# Patient Record
Sex: Male | Born: 1965 | Race: Black or African American | Hispanic: No | Marital: Single | Smoking: Former smoker
Health system: Southern US, Community
[De-identification: ages and names within clinical notes are randomized; demographics above are authoritative.]

## PROBLEM LIST (undated history)

## (undated) DIAGNOSIS — E876 Hypokalemia: Secondary | ICD-10-CM

## (undated) DIAGNOSIS — I10 Essential (primary) hypertension: Secondary | ICD-10-CM

## (undated) DIAGNOSIS — I509 Heart failure, unspecified: Secondary | ICD-10-CM

## (undated) HISTORY — PX: NO PAST SURGERIES: SHX2092

## (undated) HISTORY — DX: Hypokalemia: E87.6

---

## 2015-06-12 DIAGNOSIS — I509 Heart failure, unspecified: Secondary | ICD-10-CM | POA: Insufficient documentation

## 2015-06-12 DIAGNOSIS — E785 Hyperlipidemia, unspecified: Secondary | ICD-10-CM | POA: Insufficient documentation

## 2015-06-12 DIAGNOSIS — R609 Edema, unspecified: Secondary | ICD-10-CM

## 2015-06-12 HISTORY — DX: Edema, unspecified: R60.9

## 2015-06-12 HISTORY — DX: Hyperlipidemia, unspecified: E78.5

## 2015-06-13 HISTORY — DX: Morbid (severe) obesity due to excess calories: E66.01

## 2015-07-24 DIAGNOSIS — E119 Type 2 diabetes mellitus without complications: Secondary | ICD-10-CM | POA: Insufficient documentation

## 2015-07-24 HISTORY — DX: Type 2 diabetes mellitus without complications: E11.9

## 2017-11-05 ENCOUNTER — Emergency Department (HOSPITAL_BASED_OUTPATIENT_CLINIC_OR_DEPARTMENT_OTHER)
Admission: EM | Admit: 2017-11-05 | Discharge: 2017-11-05 | Disposition: A | Discharge status: Home / Self Care | Payer: Self-pay | Attending: Emergency Medicine | Admitting: Emergency Medicine

## 2017-11-05 ENCOUNTER — Emergency Department (HOSPITAL_BASED_OUTPATIENT_CLINIC_OR_DEPARTMENT_OTHER): Payer: Self-pay

## 2017-11-05 ENCOUNTER — Other Ambulatory Visit: Payer: Self-pay

## 2017-11-05 ENCOUNTER — Encounter (HOSPITAL_BASED_OUTPATIENT_CLINIC_OR_DEPARTMENT_OTHER): Payer: Self-pay

## 2017-11-05 DIAGNOSIS — I110 Hypertensive heart disease with heart failure: Secondary | ICD-10-CM | POA: Insufficient documentation

## 2017-11-05 DIAGNOSIS — R05 Cough: Secondary | ICD-10-CM | POA: Insufficient documentation

## 2017-11-05 DIAGNOSIS — I509 Heart failure, unspecified: Secondary | ICD-10-CM | POA: Insufficient documentation

## 2017-11-05 DIAGNOSIS — Z87891 Personal history of nicotine dependence: Secondary | ICD-10-CM | POA: Insufficient documentation

## 2017-11-05 DIAGNOSIS — R600 Localized edema: Secondary | ICD-10-CM | POA: Insufficient documentation

## 2017-11-05 DIAGNOSIS — I10 Essential (primary) hypertension: Secondary | ICD-10-CM

## 2017-11-05 HISTORY — DX: Heart failure, unspecified: I50.9

## 2017-11-05 HISTORY — DX: Essential (primary) hypertension: I10

## 2017-11-05 LAB — CBC
HCT: 40.3 % (ref 39.0–52.0)
HEMOGLOBIN: 12.8 g/dL — AB (ref 13.0–17.0)
MCH: 27 pg (ref 26.0–34.0)
MCHC: 31.8 g/dL (ref 30.0–36.0)
MCV: 85 fL (ref 78.0–100.0)
PLATELETS: 347 10*3/uL (ref 150–400)
RBC: 4.74 MIL/uL (ref 4.22–5.81)
RDW: 15.6 % — ABNORMAL HIGH (ref 11.5–15.5)
WBC: 8.1 10*3/uL (ref 4.0–10.5)

## 2017-11-05 LAB — COMPREHENSIVE METABOLIC PANEL
ALBUMIN: 3.6 g/dL (ref 3.5–5.0)
ALT: 28 U/L (ref 17–63)
ANION GAP: 11 (ref 5–15)
AST: 19 U/L (ref 15–41)
Alkaline Phosphatase: 58 U/L (ref 38–126)
BILIRUBIN TOTAL: 1 mg/dL (ref 0.3–1.2)
BUN: 13 mg/dL (ref 6–20)
CHLORIDE: 105 mmol/L (ref 101–111)
CO2: 26 mmol/L (ref 22–32)
Calcium: 8.7 mg/dL — ABNORMAL LOW (ref 8.9–10.3)
Creatinine, Ser: 1.13 mg/dL (ref 0.61–1.24)
GFR calc Af Amer: >60 mL/min (ref >60)
GLUCOSE: 129 mg/dL — AB (ref 65–99)
POTASSIUM: 4.1 mmol/L (ref 3.5–5.1)
Sodium: 142 mmol/L (ref 135–145)
TOTAL PROTEIN: 6.5 g/dL (ref 6.5–8.1)

## 2017-11-05 LAB — BRAIN NATRIURETIC PEPTIDE: B NATRIURETIC PEPTIDE 5: 1277 pg/mL — AB (ref 0.0–100.0)

## 2017-11-05 LAB — TROPONIN I: Troponin I: 0.07 ng/mL (ref <0.03)

## 2017-11-05 MED ORDER — FUROSEMIDE 40 MG PO TABS: 40.0000 mg | ORAL_TABLET | Freq: Every day | ORAL | 0 refills | Status: DC

## 2017-11-05 MED ORDER — FUROSEMIDE 10 MG/ML IJ SOLN
80.0000 mg | Freq: Once | INTRAMUSCULAR | Status: AC
  Administered 2017-11-05: 80 mg via INTRAVENOUS
  Filled 2017-11-05: qty 8

## 2017-11-05 MED ORDER — METOPROLOL TARTRATE 5 MG/5ML IV SOLN
5.0000 mg | Freq: Once | INTRAVENOUS | Status: AC
  Administered 2017-11-05: 5 mg via INTRAVENOUS
  Filled 2017-11-05: qty 5

## 2017-11-05 MED ORDER — CARVEDILOL 25 MG PO TABS: 25.0000 mg | ORAL_TABLET | Freq: Two times a day (BID) | ORAL | 0 refills | Status: DC

## 2017-11-05 MED ORDER — AMLODIPINE BESYLATE 5 MG PO TABS
10.0000 mg | ORAL_TABLET | Freq: Once | ORAL | Status: AC
  Administered 2017-11-05: 10 mg via ORAL
  Filled 2017-11-05: qty 2

## 2017-11-05 MED ORDER — CARVEDILOL 25 MG PO TABS
25.0000 mg | ORAL_TABLET | Freq: Two times a day (BID) | ORAL | Status: DC
  Filled 2017-11-05: qty 1

## 2017-11-05 MED ORDER — LOSARTAN POTASSIUM 100 MG PO TABS: 100.0000 mg | ORAL_TABLET | Freq: Every day | ORAL | 0 refills | Status: DC

## 2017-11-05 MED ORDER — LOSARTAN POTASSIUM 50 MG PO TABS
100.0000 mg | ORAL_TABLET | Freq: Once | ORAL | Status: DC
  Filled 2017-11-05: qty 2

## 2017-11-05 MED ORDER — AMLODIPINE BESYLATE 10 MG PO TABS: 10.0000 mg | ORAL_TABLET | Freq: Every day | ORAL | 0 refills | Status: DC

## 2017-11-05 MED ORDER — HYDRALAZINE HCL 20 MG/ML IJ SOLN
10.0000 mg | Freq: Once | INTRAMUSCULAR | Status: AC
  Administered 2017-11-05: 10 mg via INTRAVENOUS
  Filled 2017-11-05: qty 1

## 2017-11-05 NOTE — Progress Notes (Signed)
Pt seen in triage by this RT. Rhonchi in right lower lung noted. Otherwise clear.

## 2017-11-05 NOTE — ED Notes (Signed)
Date and time results received: 11/05/17 2133   Test: toponin Critical Value: 0.07  Name of Provider Notified: Dr. Fayrene Fearing  Orders Received? Or Actions Taken?: no new orders

## 2017-11-05 NOTE — ED Triage Notes (Signed)
C/o SOB x 7 days-bilat feet swelling x 3-4 days-NAD-steady gait

## 2017-11-05 NOTE — Discharge Instructions (Signed)
Take medicines as prescribed. Contact yourcardiologist for appointment. Weigh yourself daily.

## 2017-11-05 NOTE — ED Provider Notes (Signed)
[facility] EMERGENCY DEPARTMENT Provider Note   CSN: [id number] Arrival date & time: 11/05/17  2033     History   Chief Complaint Chief Complaint  Patient presents with  . Shortness of Breath    HPI Jim Garcia is a 52 y.o. male. Chief complaint is shortness of breath  HPI:  52 year old male. History of CHF. His been off his meds for "several months". Has had increasing swelling of his legs. Some shortness of breath at night. No acute symptoms. States he came in tonight because his sister encouraged him to do so. He's had no chest pain. Has a dry cough at night. No fever.  Past Medical History:  Diagnosis Date  . CHF (congestive heart failure) (HCC)   . Hypertension     There are no active problems to display for this patient.   History reviewed. No pertinent surgical history.     Home Medications    Prior to Admission medications   Medication Sig Start Date End Date Taking? Authorizing Provider  amLODipine (NORVASC) 10 MG tablet Take 1 tablet (10 mg total) by mouth daily. 11/05/17   Rolland Porter, MD  carvedilol (COREG) 25 MG tablet Take 1 tablet (25 mg total) by mouth 2 (two) times daily with a meal. 11/05/17   Rolland Porter, MD  furosemide (LASIX) 40 MG tablet Take 1 tablet (40 mg total) by mouth daily. 11/05/17   Rolland Porter, MD  losartan (COZAAR) 100 MG tablet Take 1 tablet (100 mg total) by mouth daily. 11/05/17   Rolland Porter, MD    Family History No family history on file.  Social History Social History   Tobacco Use  . Smoking status: Former Smoker  Substance Use Topics  . Alcohol use: Yes    Comment: occ  . Drug use: Yes    Types: Marijuana     Allergies   Patient has no known allergies.   Review of Systems Review of Systems  Constitutional: Negative for appetite change, chills, diaphoresis, fatigue and fever.  HENT: Negative for mouth sores, sore throat and trouble swallowing.   Eyes: Negative for visual disturbance.  Respiratory:  Positive for cough and shortness of breath. Negative for chest tightness and wheezing.   Cardiovascular: Positive for leg swelling. Negative for chest pain.  Gastrointestinal: Negative for abdominal distention, abdominal pain, diarrhea, nausea and vomiting.  Endocrine: Negative for polydipsia, polyphagia and polyuria.  Genitourinary: Negative for dysuria, frequency and hematuria.  Musculoskeletal: Negative for gait problem.  Skin: Negative for color change, pallor and rash.  Neurological: Negative for dizziness, syncope, light-headedness and headaches.  Hematological: Does not bruise/bleed easily.  Psychiatric/Behavioral: Negative for behavioral problems and confusion.     Physical Exam Updated Vital Signs BP (!) 165/133   Pulse 84   Temp 99 F (37.2 C) (Oral)   Resp 16   Ht 6\' 3"  (1.905 m)   Wt (!) 139.8 kg (308 lb 3.3 oz)   SpO2 97%   BMI 38.52 kg/m   Physical Exam  Constitutional: He is oriented to person, place, and time. He appears well-developed and well-nourished. No distress.  HENT:  Head: Normocephalic.  Eyes: Conjunctivae are normal. Pupils are equal, round, and reactive to light. No scleral icterus.  Neck: Normal range of motion. Neck supple. No thyromegaly present.  Cardiovascular: Normal rate and regular rhythm. Exam reveals no gallop and no friction rub.  No murmur heard. Pulmonary/Chest: Effort normal. No respiratory distress. He has no wheezes. He has no rales.  Faint  crackles at the bases. Overall no increased work of breathing. He is saturating 92%- 96% on room air.  Abdominal: Soft. Bowel sounds are normal. He exhibits no distension. There is no tenderness. There is no rebound.  Musculoskeletal: Normal range of motion.  Neurological: He is alert and oriented to person, place, and time.  Skin: Skin is warm and dry. No rash noted.  1-2+ symmetric lower extremity edema  Psychiatric: He has a normal mood and affect. His behavior is normal.     ED Treatments  / Results  Labs (all labs ordered are listed, but only abnormal results are displayed) Labs Reviewed  CBC - Abnormal; Notable for the following components:      Result Value   Hemoglobin 12.8 (*)    RDW 15.6 (*)    All other components within normal limits  TROPONIN I - Abnormal; Notable for the following components:   Troponin I 0.07 (*)    All other components within normal limits  BRAIN NATRIURETIC PEPTIDE - Abnormal; Notable for the following components:   B Natriuretic Peptide 1,277.0 (*)    All other components within normal limits  COMPREHENSIVE METABOLIC PANEL - Abnormal; Notable for the following components:   Glucose, Bld 129 (*)    Calcium 8.7 (*)    All other components within normal limits    EKG  EKG Interpretation None       Radiology Dg Chest 2 View  Result Date: 11/05/2017 CLINICAL DATA:  Shortness of breath for 1 week EXAM: CHEST  2 VIEW COMPARISON:  07/24/2015 FINDINGS: Cardiac shadow is enlarged. Vascular congestion is noted with very mild interstitial edema consistent with mild CHF. No focal infiltrate or sizable effusion is noted. IMPRESSION: Mild CHF. Electronically Signed   By: Alcide Clever M.D.   On: 11/05/2017 21:40    Procedures Procedures (including critical care time)  Medications Ordered in ED Medications  amLODipine (NORVASC) tablet 10 mg (not administered)  furosemide (LASIX) injection 80 mg (80 mg Intravenous Given 11/05/17 2212)  hydrALAZINE (APRESOLINE) injection 10 mg (10 mg Intravenous Given 11/05/17 2217)  metoprolol tartrate (LOPRESSOR) injection 5 mg (5 mg Intravenous Given 11/05/17 2218)     Initial Impression / Assessment and Plan / ED Course  I have reviewed the triage vital signs and the nursing notes.  Pertinent labs & imaging results that were available during my care of the patient were reviewed by me and considered in my medical decision making (see chart for details).    EKG with LVH. No obvious ST changes. Troponin 0.07.  BNP elevated over 1200.  Discussed with patient my preference to be to the hospital, serial enzymes, cardiac consultation. Reinstitution of medications. He is fairly adamant that he would not stay in the hospital. When discussed about possibility of ischemic worsening he stated that he would take aspirin at home. I've attempted to optimize his treatment here as best I could. His given IV Lasix, hydralazine, labetalol. His symptoms have improved. He is urinating over 1200 mL. He saturating 95%. States he feels "great". Refill of his medications. Discharged home. Encouraged return at anytime with any failure to improve or worsening symptoms.  We discussed possibilities of worsening including death disability sudden cardiac death hypoxemia neurological devastation   Final Clinical Impressions(s) / ED Diagnoses   Final diagnoses:  Chronic congestive heart failure, unspecified heart failure type (HCC)  Hypertension, unspecified type    ED Discharge Orders        Ordered    amLODipine (  NORVASC) 10 MG tablet  Daily     11/05/17 2321    losartan (COZAAR) 100 MG tablet  Daily     11/05/17 2321    carvedilol (COREG) 25 MG tablet  2 times daily with meals     11/05/17 2321    furosemide (LASIX) 40 MG tablet  Daily     11/05/17 2321       Rolland Porter, MD 11/05/17 2326

## 2018-04-14 ENCOUNTER — Other Ambulatory Visit: Payer: Self-pay

## 2018-04-14 ENCOUNTER — Emergency Department (HOSPITAL_BASED_OUTPATIENT_CLINIC_OR_DEPARTMENT_OTHER): Payer: Self-pay

## 2018-04-14 ENCOUNTER — Encounter (HOSPITAL_BASED_OUTPATIENT_CLINIC_OR_DEPARTMENT_OTHER): Payer: Self-pay

## 2018-04-14 ENCOUNTER — Inpatient Hospital Stay (HOSPITAL_BASED_OUTPATIENT_CLINIC_OR_DEPARTMENT_OTHER)
Admission: EM | Admit: 2018-04-14 | Discharge: 2018-04-20 | DRG: 292 | Disposition: A | Discharge status: Home / Self Care | Payer: Self-pay | Attending: Cardiovascular Disease | Admitting: Cardiovascular Disease

## 2018-04-14 DIAGNOSIS — G4733 Obstructive sleep apnea (adult) (pediatric): Secondary | ICD-10-CM | POA: Diagnosis present

## 2018-04-14 DIAGNOSIS — I272 Pulmonary hypertension, unspecified: Secondary | ICD-10-CM | POA: Diagnosis present

## 2018-04-14 DIAGNOSIS — E876 Hypokalemia: Secondary | ICD-10-CM | POA: Diagnosis present

## 2018-04-14 DIAGNOSIS — T461X6A Underdosing of calcium-channel blockers, initial encounter: Secondary | ICD-10-CM | POA: Diagnosis present

## 2018-04-14 DIAGNOSIS — Z91120 Patient's intentional underdosing of medication regimen due to financial hardship: Secondary | ICD-10-CM

## 2018-04-14 DIAGNOSIS — I110 Hypertensive heart disease with heart failure: Principal | ICD-10-CM | POA: Diagnosis present

## 2018-04-14 DIAGNOSIS — R7989 Other specified abnormal findings of blood chemistry: Secondary | ICD-10-CM | POA: Diagnosis present

## 2018-04-14 DIAGNOSIS — E119 Type 2 diabetes mellitus without complications: Secondary | ICD-10-CM | POA: Diagnosis present

## 2018-04-14 DIAGNOSIS — E785 Hyperlipidemia, unspecified: Secondary | ICD-10-CM | POA: Diagnosis present

## 2018-04-14 DIAGNOSIS — I248 Other forms of acute ischemic heart disease: Secondary | ICD-10-CM | POA: Diagnosis present

## 2018-04-14 DIAGNOSIS — I428 Other cardiomyopathies: Secondary | ICD-10-CM

## 2018-04-14 DIAGNOSIS — T447X6A Underdosing of beta-adrenoreceptor antagonists, initial encounter: Secondary | ICD-10-CM | POA: Diagnosis present

## 2018-04-14 DIAGNOSIS — I10 Essential (primary) hypertension: Secondary | ICD-10-CM | POA: Diagnosis present

## 2018-04-14 DIAGNOSIS — I5043 Acute on chronic combined systolic (congestive) and diastolic (congestive) heart failure: Secondary | ICD-10-CM

## 2018-04-14 DIAGNOSIS — Z6839 Body mass index (BMI) 39.0-39.9, adult: Secondary | ICD-10-CM

## 2018-04-14 DIAGNOSIS — T501X6A Underdosing of loop [high-ceiling] diuretics, initial encounter: Secondary | ICD-10-CM | POA: Diagnosis present

## 2018-04-14 DIAGNOSIS — I509 Heart failure, unspecified: Secondary | ICD-10-CM

## 2018-04-14 DIAGNOSIS — T465X6A Underdosing of other antihypertensive drugs, initial encounter: Secondary | ICD-10-CM | POA: Diagnosis present

## 2018-04-14 DIAGNOSIS — R778 Other specified abnormalities of plasma proteins: Secondary | ICD-10-CM | POA: Diagnosis present

## 2018-04-14 HISTORY — DX: Acute on chronic combined systolic (congestive) and diastolic (congestive) heart failure: I50.43

## 2018-04-14 LAB — BASIC METABOLIC PANEL
ANION GAP: 9 (ref 5–15)
BUN: 8 mg/dL (ref 6–20)
CALCIUM: 8.1 mg/dL — AB (ref 8.9–10.3)
CHLORIDE: 103 mmol/L (ref 101–111)
CO2: 27 mmol/L (ref 22–32)
Creatinine, Ser: 0.96 mg/dL (ref 0.61–1.24)
GFR calc non Af Amer: >60 mL/min (ref >60)
Glucose, Bld: 121 mg/dL — ABNORMAL HIGH (ref 65–99)
Potassium: 2.9 mmol/L — ABNORMAL LOW (ref 3.5–5.1)
SODIUM: 139 mmol/L (ref 135–145)

## 2018-04-14 LAB — CBC
HCT: 35.1 % — ABNORMAL LOW (ref 39.0–52.0)
HEMOGLOBIN: 11.2 g/dL — AB (ref 13.0–17.0)
MCH: 23.6 pg — AB (ref 26.0–34.0)
MCHC: 31.9 g/dL (ref 30.0–36.0)
MCV: 74.1 fL — ABNORMAL LOW (ref 78.0–100.0)
Platelets: 339 10*3/uL (ref 150–400)
RBC: 4.74 MIL/uL (ref 4.22–5.81)
RDW: 17.8 % — ABNORMAL HIGH (ref 11.5–15.5)
WBC: 7.7 10*3/uL (ref 4.0–10.5)

## 2018-04-14 LAB — BRAIN NATRIURETIC PEPTIDE: B Natriuretic Peptide: 1540.9 pg/mL — ABNORMAL HIGH (ref 0.0–100.0)

## 2018-04-14 LAB — TROPONIN I: Troponin I: 0.08 ng/mL (ref <0.03)

## 2018-04-14 MED ORDER — FUROSEMIDE 10 MG/ML IJ SOLN
80.0000 mg | Freq: Once | INTRAMUSCULAR | Status: AC
  Administered 2018-04-14: 80 mg via INTRAVENOUS
  Filled 2018-04-14: qty 8

## 2018-04-14 MED ORDER — OXYCODONE-ACETAMINOPHEN 5-325 MG PO TABS: 2.0000 | ORAL_TABLET | Freq: Once | ORAL | Status: DC

## 2018-04-14 MED ORDER — FUROSEMIDE 10 MG/ML IJ SOLN
40.0000 mg | Freq: Once | INTRAMUSCULAR | Status: DC
  Filled 2018-04-14: qty 4

## 2018-04-14 MED ORDER — POTASSIUM CHLORIDE CRYS ER 20 MEQ PO TBCR
40.0000 meq | EXTENDED_RELEASE_TABLET | Freq: Once | ORAL | Status: AC
  Administered 2018-04-14: 40 meq via ORAL
  Filled 2018-04-14: qty 2

## 2018-04-14 NOTE — ED Triage Notes (Signed)
Pt c/o bilat leg swelling x 2 weeks-also c/o SOB-NAD-steady gait

## 2018-04-14 NOTE — ED Provider Notes (Signed)
[facility] EMERGENCY DEPARTMENT Provider Note   CSN: [id number] Arrival date & time: 04/14/18  1549     History   Chief Complaint Chief Complaint  Patient presents with  . Leg Swelling    HPI Jim Garcia is a 52 y.o. 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  The patient endorses dyspnea at baseline with exertion that has progressively worsened over the last 2 weeks with associated bilateral lower extremity and abdominal swelling, but significantly worsened over the last day.  He reports that he is now having mild dyspnea at rest and a cough with some frothy pink sputum.  He has been more tired and fatigued since symptoms have worsened.  He denies orthopnea.  He sleeps with 2 pillows at night, which is unchanged.  He reports that he has had of his home Lasix because he has not followed up with his cardiologist "in a long time", but he was able to obtain some 20 mg tablets from a friend's which he has been taking sporadically over the last 2 weeks.  He denies fever, chills, nausea, vomiting, diarrhea, chest pain or tightness, dizziness, lightheadedness, or syncope.  He reports that he does not weigh himself at home.  He reports that when he was previously seen in the emergency department several months ago at that he was diuresed with Lasix and got down to 280 pounds.  He reports that today he was 313 pounds.  Cardiologist: Dr. Roxine Caddy  Cardiac cath in Sept 2016.    The history is provided by the patient.  No language interpreter was used.    Past Medical History:  Diagnosis Date  . CHF (congestive heart failure) (HCC)   . Hypertension     Patient Active Problem List   Diagnosis Date Noted  . Acute on chronic congestive heart failure (HCC) 04/14/2018    History reviewed. No pertinent surgical  history.      Home Medications    Prior to Admission medications   Medication Sig Start Date End Date Taking? Authorizing Provider  amLODipine (NORVASC) 10 MG tablet Take 1 tablet (10 mg total) by mouth daily. 11/05/17   Rolland Porter, MD  carvedilol (COREG) 25 MG tablet Take 1 tablet (25 mg total) by mouth 2 (two) times daily with a meal. 11/05/17   Rolland Porter, MD  furosemide (LASIX) 40 MG tablet Take 1 tablet (40 mg total) by mouth daily. 11/05/17   Rolland Porter, MD  losartan (COZAAR) 100 MG tablet Take 1 tablet (100 mg total) by mouth daily. 11/05/17   Rolland Porter, MD    Family History No family history on file.  Social History Social History   Tobacco Use  . Smoking status: Former Games developer  . Smokeless tobacco: Never Used  Substance Use Topics  . Alcohol use: Yes    Comment: occ  . Drug use: Yes    Types: Marijuana     Allergies   Patient has no known allergies.   Review of Systems Review of Systems  Constitutional: Negative for appetite change, chills, diaphoresis and fever.  HENT: Negative for congestion.   Eyes: Negative for visual disturbance.  Respiratory: Positive for cough and shortness of breath.   Cardiovascular: Positive for leg swelling. Negative for chest pain and palpitations.  Gastrointestinal: Positive for abdominal distention. Negative for abdominal pain, diarrhea, nausea and vomiting.  Genitourinary: Negative for dysuria.  Musculoskeletal: Negative for back pain, myalgias, neck pain and neck stiffness.  Skin: Negative for rash.  Allergic/Immunologic: Negative for immunocompromised state.  Neurological: Negative for dizziness, weakness and headaches.  Psychiatric/Behavioral: Negative for confusion.    Physical Exam Updated Vital Signs BP (!) 133/105   Pulse 88   Temp 98.5 F (36.9 C) (Oral)   Resp (!) 26   Ht 6\' 3"  (1.905 m)   Wt (!) 142 kg (313 lb)   SpO2 96%   BMI 39.12 kg/m    Physical Exam  Constitutional: He appears well-developed. No  distress.  Appears fluid overloaded  HENT:  Head: Normocephalic.  Eyes: Conjunctivae are normal.  Neck: Neck supple.  Cardiovascular: Normal rate, regular rhythm, normal heart sounds and intact distal pulses. Exam reveals no gallop and no friction rub.  No murmur heard. Pulmonary/Chest: Effort normal. No stridor. No respiratory distress. He has no rales.  Crackles in the bilateral bases.  Patient speaking in complete, fluent sentences.  No conversational dyspnea.  Abdominal: Soft. He exhibits no distension and no mass. There is no rebound and no guarding. No hernia.  Abdomen is distended, but soft.  Positive fluid wave.  Musculoskeletal:  3+ pitting edema to the bilateral lower extremities.  Neurological: He is alert.  Skin: Skin is warm and dry. He is not diaphoretic.  Psychiatric: His behavior is normal.  Nursing note and vitals reviewed.  ED Treatments / Results  Labs (all labs ordered are listed, but only abnormal results are displayed) Labs Reviewed  BASIC METABOLIC PANEL - Abnormal; Notable for the following components:      Result Value   Potassium 2.9 (*)    Glucose, Bld 121 (*)    Calcium 8.1 (*)    All other components within normal limits  CBC - Abnormal; Notable for the following components:   Hemoglobin 11.2 (*)    HCT 35.1 (*)    MCV 74.1 (*)    MCH 23.6 (*)    RDW 17.8 (*)    All other components within normal limits  TROPONIN I - Abnormal; Notable for the following components:   Troponin I 0.08 (*)    All other components within normal limits  BRAIN NATRIURETIC PEPTIDE - Abnormal; Notable for the following components:   B Natriuretic Peptide 1,540.9 (*)    All other components within normal limits    EKG EKG Interpretation  Date/Time:  Wednesday April 14 2018 16:45:40 EDT Ventricular Rate:  87 PR Interval:    QRS Duration: 134 QT Interval:  410 QTC Calculation: 494 R Axis:   -84 Text Interpretation:  Sinus rhythm Probable left atrial enlargement  Left bundle branch block No STEMI. Similar to prior.  Confirmed by Alona Bene [phone]) on 04/14/2018 4:58:31 PM   Radiology Dg Chest 2 View  Result Date: 04/14/2018 CLINICAL DATA:  Shortness of breath and bilateral leg swelling for the past 2 weeks. EXAM: CHEST - 2 VIEW COMPARISON:  Chest x-ray dated November 05, 2017. FINDINGS: Stable cardiomegaly. Pulmonary vascular congestion. New hazy opacity in the right lower lobe. No pleural effusion or pneumothorax. No acute osseous abnormality. IMPRESSION: 1. New hazy opacity in the right lower lobe may reflect asymmetric edema or pneumonia. 2. Stable cardiomegaly and pulmonary vascular congestion. Electronically Signed   By: Obie Dredge M.D.   On: 04/14/2018 17:14    Procedures .Critical Care  Performed by: Barkley Boards, PA-C  Authorized by: Barkley Boards, PA-C   Critical care  provider statement:    Critical care time (minutes):  45   Critical care time was exclusive of:  Separately billable procedures and treating other patients and teaching time   Critical care was necessary to treat or prevent imminent or life-threatening deterioration of the following conditions:  Cardiac failure   Critical care was time spent personally by me on the following activities:  Ordering and performing treatments and interventions, ordering and review of laboratory studies, ordering and review of radiographic studies, pulse oximetry, re-evaluation of patient's condition, obtaining history from patient or surrogate, discussions with consultants, evaluation of patient's response to treatment, examination of patient and review of old charts   (including critical care time)  Medications Ordered in ED Medications  potassium chloride SA (K-DUR,KLOR-CON) CR tablet 40 mEq (40 mEq Oral Given 04/14/18 1745)  furosemide (LASIX) injection 80 mg (80 mg Intravenous Given 04/14/18 1752)     Initial Impression / Assessment and Plan / ED Course  I have reviewed the triage  vital signs and the nursing notes.  Pertinent labs & imaging results that were available during my care of the patient were reviewed by me and considered in my medical decision making (see chart for details).     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 On exam, he appears fluid overloaded. He has pitting edema in the bilateral lower extremities. Positive fluid wave.  He does not weigh himself at home, but weighed 280 after his last ED visit several months ago, and was 313 pounds today.  Chest x-ray with hazy opacity in the right lower lobe concerning for asymmetric edema or pneumonia.  He has pulmonary vascular congestion.  BNP 1541, up from 1277 in January.  80 mg of IV Lasix given in the ED.  Troponin is 0.08, likely secondary to demand.  His troponin was 0.07 on 11/05/17.  EKG unchanged from previous.  Hypokalemic at 2.9, replenished with 40 mEq of potassium chloride in the ED.  The patient was discussed with Dr. Jacqulyn Bath, attending physician.  Spoke with cardiologist, Dr. Purvis Sheffield, will accept the patient for admission at [facility].  Consult to case management placed.  The patient appears reasonably stabilized for admission considering the current resources, flow, and capabilities available in the ED at this time, and I doubt any other [facility] requiring further screening and/or treatment in the ED prior to admission.   Final Clinical Impressions(s) / ED Diagnoses   Final diagnoses:  Acute on chronic congestive heart failure, unspecified heart failure type [facility])    ED Discharge Orders    None      Barkley Boards, PA-C 04/14/18 2048  Maia Plan, MD 04/15/18 1351

## 2018-04-14 NOTE — Progress Notes (Signed)
Patient states that he wear CPAP at home with a full face mask and humidity with distilled water.  Patients states that he has a ramp setting of 4 cm H2O that runs for 20 minutes to allow to him to fall asleep and then it ramps up to to between 12 and 16 after 20 minutes.

## 2018-04-14 NOTE — ED Notes (Signed)
Pt being transported by [facility] at this time. 

## 2018-04-15 ENCOUNTER — Encounter (HOSPITAL_COMMUNITY): Payer: Self-pay

## 2018-04-15 ENCOUNTER — Inpatient Hospital Stay (HOSPITAL_COMMUNITY): Payer: Self-pay

## 2018-04-15 DIAGNOSIS — I5021 Acute systolic (congestive) heart failure: Secondary | ICD-10-CM | POA: Insufficient documentation

## 2018-04-15 DIAGNOSIS — G4733 Obstructive sleep apnea (adult) (pediatric): Secondary | ICD-10-CM

## 2018-04-15 DIAGNOSIS — I10 Essential (primary) hypertension: Secondary | ICD-10-CM

## 2018-04-15 DIAGNOSIS — I428 Other cardiomyopathies: Secondary | ICD-10-CM

## 2018-04-15 DIAGNOSIS — I340 Nonrheumatic mitral (valve) insufficiency: Secondary | ICD-10-CM

## 2018-04-15 DIAGNOSIS — E876 Hypokalemia: Secondary | ICD-10-CM

## 2018-04-15 DIAGNOSIS — I361 Nonrheumatic tricuspid (valve) insufficiency: Secondary | ICD-10-CM

## 2018-04-15 DIAGNOSIS — I5023 Acute on chronic systolic (congestive) heart failure: Secondary | ICD-10-CM | POA: Insufficient documentation

## 2018-04-15 HISTORY — DX: Obstructive sleep apnea (adult) (pediatric): G47.33

## 2018-04-15 HISTORY — DX: Essential (primary) hypertension: I10

## 2018-04-15 HISTORY — DX: Other cardiomyopathies: I42.8

## 2018-04-15 LAB — BASIC METABOLIC PANEL
ANION GAP: 11 (ref 5–15)
BUN: 9 mg/dL (ref 6–20)
CALCIUM: 8.4 mg/dL — AB (ref 8.9–10.3)
CO2: 31 mmol/L (ref 22–32)
CREATININE: 1.08 mg/dL (ref 0.61–1.24)
Chloride: 100 mmol/L — ABNORMAL LOW (ref 101–111)
GFR calc Af Amer: >60 mL/min (ref >60)
GLUCOSE: 132 mg/dL — AB (ref 65–99)
Potassium: 3.4 mmol/L — ABNORMAL LOW (ref 3.5–5.1)
Sodium: 142 mmol/L (ref 135–145)

## 2018-04-15 LAB — ECHOCARDIOGRAM COMPLETE
Height: 75 in
WEIGHTICAEL: 4800 [oz_av]

## 2018-04-15 LAB — HIV ANTIBODY (ROUTINE TESTING W REFLEX): HIV Screen 4th Generation wRfx: NONREACTIVE

## 2018-04-15 LAB — CREATININE, SERUM
CREATININE: 1.08 mg/dL (ref 0.61–1.24)
GFR calc Af Amer: >60 mL/min (ref >60)

## 2018-04-15 MED ORDER — POTASSIUM CHLORIDE 10 MEQ/100ML IV SOLN
10.0000 meq | Freq: Once | INTRAVENOUS | Status: AC
  Administered 2018-04-15: 10 meq via INTRAVENOUS
  Filled 2018-04-15: qty 100

## 2018-04-15 MED ORDER — FUROSEMIDE 10 MG/ML IJ SOLN
80.0000 mg | Freq: Two times a day (BID) | INTRAMUSCULAR | Status: AC
  Administered 2018-04-15 – 2018-04-19 (×9): 80 mg via INTRAVENOUS
  Filled 2018-04-15 (×10): qty 8

## 2018-04-15 MED ORDER — SODIUM CHLORIDE 0.9% FLUSH
3.0000 mL | Freq: Two times a day (BID) | INTRAVENOUS | Status: DC
  Administered 2018-04-15 – 2018-04-20 (×9): 3 mL via INTRAVENOUS

## 2018-04-15 MED ORDER — ONDANSETRON HCL 4 MG/2ML IJ SOLN: 4.0000 mg | Freq: Four times a day (QID) | INTRAMUSCULAR | Status: DC | PRN

## 2018-04-15 MED ORDER — ENOXAPARIN SODIUM 40 MG/0.4ML ~~LOC~~ SOLN
40.0000 mg | Freq: Every day | SUBCUTANEOUS | Status: DC
  Administered 2018-04-15 – 2018-04-20 (×6): 40 mg via SUBCUTANEOUS
  Filled 2018-04-15 (×6): qty 0.4

## 2018-04-15 MED ORDER — LISINOPRIL 2.5 MG PO TABS
2.5000 mg | ORAL_TABLET | Freq: Every day | ORAL | Status: DC
  Administered 2018-04-15 – 2018-04-16 (×2): 2.5 mg via ORAL
  Filled 2018-04-15 (×2): qty 1

## 2018-04-15 MED ORDER — SODIUM CHLORIDE 0.9 % IV SOLN: 250.0000 mL | INTRAVENOUS | Status: DC | PRN

## 2018-04-15 MED ORDER — ACETAMINOPHEN 325 MG PO TABS: 650.0000 mg | ORAL_TABLET | ORAL | Status: DC | PRN

## 2018-04-15 MED ORDER — SODIUM CHLORIDE 0.9% FLUSH
3.0000 mL | INTRAVENOUS | Status: DC | PRN
  Administered 2018-04-18: 3 mL via INTRAVENOUS
  Filled 2018-04-15: qty 3

## 2018-04-15 MED ORDER — CARVEDILOL 3.125 MG PO TABS
3.1250 mg | ORAL_TABLET | Freq: Two times a day (BID) | ORAL | Status: DC
  Administered 2018-04-15: 3.125 mg via ORAL
  Filled 2018-04-15: qty 1

## 2018-04-15 MED ORDER — CARVEDILOL 6.25 MG PO TABS
6.2500 mg | ORAL_TABLET | Freq: Two times a day (BID) | ORAL | Status: DC
  Administered 2018-04-15 – 2018-04-18 (×6): 6.25 mg via ORAL
  Filled 2018-04-15 (×6): qty 1

## 2018-04-15 NOTE — Progress Notes (Signed)
Progress Note  Patient Name: Jim Garcia Date of Encounter: 04/15/2018  Primary Cardiologist: New to [facility] - would like to follow in [facility]  Subjective   Breathing is improved since yesterday. Still with SOB with ambulation. States he is feeling more rested. Denies chest pain or SOB.  Inpatient Medications    Scheduled Meds: . carvedilol  3.125 mg Oral BID WC  . enoxaparin (LOVENOX) injection  40 mg Subcutaneous Daily  . furosemide  80 mg Intravenous Q12H  . lisinopril  2.5 mg Oral Daily  . sodium chloride flush  3 mL Intravenous Q12H   Continuous Infusions: . sodium chloride     PRN Meds: sodium chloride, acetaminophen, ondansetron (ZOFRAN) IV, sodium chloride flush   Vital Signs    Vitals:   04/15/18 0700 04/15/18 0800 04/15/18 0847 04/15/18 0900  BP:      Pulse: 72 87 84 86  Resp: (!) 27 (!) 22 20 (!) 27  Temp:      TempSrc:      SpO2: 99% 95% 98% 98%  Weight:      Height:        Intake/Output Summary (Last 24 hours) at 04/15/2018 1147 Last data filed at 04/15/2018 0900 Gross per 24 hour  Intake 1130 ml  Output 6500 ml  Net -5370 ml   Filed Weights   04/14/18 1555 04/14/18 2100 04/15/18 0346  Weight: (!) 313 lb (142 kg) (!) 300 lb 11.2 oz (136.4 kg) 300 lb (136.1 kg)    Telemetry    NSR with frequent PVCs; brief episode of NSVT - Personally Reviewed  Physical Exam   GEN: Sitting on the edge of his bed in no acute distress.   Neck: difficult to assess given body habitus JVD, no carotid bruits Cardiac: RRR, no murmurs, rubs, or gallops.  Respiratory: decreased breath sounds throughout with crackles at lung bases GI: NABS, Soft, obese, nontender, non-distended  MS: 3+ LE edema; No deformity. Neuro:  Nonfocal, moving all extremities spontaneously Psych: Normal affect   Labs    Chemistry Recent Labs  Lab 04/14/18 1636 04/15/18 0436  NA 139  --   K 2.9*  --   CL 103  --   CO2 27  --   GLUCOSE 121*  --   BUN 8  --     CREATININE 0.96 1.08  CALCIUM 8.1*  --   GFRNONAA >60 >60  GFRAA >60 >60  ANIONGAP 9  --      Hematology Recent Labs  Lab 04/14/18 1636  WBC 7.7  RBC 4.74  HGB 11.2*  HCT 35.1*  MCV 74.1*  MCH 23.6*  MCHC 31.9  RDW 17.8*  PLT 339    Cardiac Enzymes Recent Labs  Lab 04/14/18 1636  TROPONINI 0.08*   No results for input(s): TROPIPOC in the last 168 hours.   BNP Recent Labs  Lab 04/14/18 1638  BNP 1,540.9*     DDimer No results for input(s): DDIMER in the last 168 hours.   Radiology    Dg Chest 2 View  Result Date: 04/14/2018 CLINICAL DATA:  Shortness of breath and bilateral leg swelling for the past 2 weeks. EXAM: CHEST - 2 VIEW COMPARISON:  Chest x-ray dated November 05, 2017. FINDINGS: Stable cardiomegaly. Pulmonary vascular congestion. New hazy opacity in the right lower lobe. No pleural effusion or pneumothorax. No acute osseous abnormality. IMPRESSION: 1. New hazy opacity in the right lower lobe may reflect asymmetric edema or pneumonia. 2. Stable cardiomegaly and  pulmonary vascular congestion. Electronically Signed   By: Obie Dredge M.D.   On: 04/14/2018 17:14    Cardiac Studies   Cardiac catheterization 2016: Conclusions Diagnostic Procedure Summary There is NO obstructive CAD. 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 Severe global LV dysfunction.EF 30% Diagnostic Procedure Recommendations Medical Therapy because no interventional therapy is required. Medical therapy for LV dysfunction.  Patient Profile     52 y.o. male with PMH of chronic systolic CHF (last known EF 35%), dilated cardiomyopathy, HTN, HLD, DM type 2, morbid obesity, OSA, who presented with SOB. Admitted to cardiology for acute on chronic systolic CHF.  Assessment & Plan    1. Acute on chronic systolic CHF/ NICM: presented with worsening SOB, orthopnea, and LE edema over the past 2 weeks. Has been off of all cardiac medications for the past several months. He was started on IV lasix 80mg   BID with good UOP: net -5.3L this admission. Weight 313-300lbs. Cr stable at 1.08 - Continue IV lasix 80mg  BID - Will increase coreg to 6.25mg  BID  - Continue lisinopril 2.5mg  daily - uptitrate as BP tolerates - Compression stockings - Echo pending  2. HTN: poorly controlled given financial constraints and inability to afford medications. Started on coreg and lisinopril for heart failure - Continue current regimen - Will likely need to uptitrate these medications prior to discharge for better BP control  3. Hypokalemia: K 2.9 on arrival; repleted with 40 mEq po and 10 mEq IV potassium; BMET pending  - Replete as needed to maintain K >4 - Monitor closely with aggressive diuresis  4. Social issues: patient is uninsured and has been unable to afford his medications for the last several months - Case management consult requested to discuss resources     For questions or updates, please contact [facility] Please consult [email or web address] for contact info under Cardiology/STEMI.      Signed, Beatriz Stallion, PA-C  04/15/2018, 11:47 AM   [phone]

## 2018-04-15 NOTE — H&P (Signed)
Cardiology Admission History and Physical:   Patient ID: Jim Garcia; MRN: [id number]; DOB: 29-Jun-1966   Admission date: 04/14/2018  Primary Care Provider: System, Pcp Not In Primary Cardiologist: Merrily Pew, MD (at [facility], [place])   Chief Complaint:  Worsening shortness of breath for 2 weeks   History of Present Illness:   [name] is a 52 year old African American gentleman with a past medical history significant for non-ischemic cardiomyopathy, obesity, obstructive sleep apnea and hypertension who is admitted to the hospital for worsening shortness of breath and leg edema for the past 2 weeks.  He has not been able to take his medications regularly for the past 1-2 years due to loss of health insurance coverage. He was last seen in the ED for CHF exacerbation in January 2019. He was sent home with a few pills that he never renewed. He has therefore been off of heart failure therapy for 4 months. He has had access to some diuretic pills that he has taken infrequently.  He denies any chest pain. His "dry weight" is around 264 lbs. Currently he weighs >300 lbs.  He has noticed significant swelling in his lower legs. He also complains of occasional PND. He denies any orthopnea.   In the past (more than 3 year ago) he followed up with Dr. Rachael Fee in [place], [place]. He had a a cardiac catheterization in 2016 that revealed normal coronary arteries.  He denies excessive alcohol use. He has never used cocaine. He occasionally smokes Marijuana.  He is also not taking his blood pressure medications. He is supposed to be on Carvedilol, Amlodipine and Losartan.   Past Medical History:  Diagnosis Date  . CHF (congestive heart failure) (HCC)   . Hypertension     History reviewed. No pertinent surgical history.   Medications Prior to Admission: Prior to Admission medications   Medication Sig Start Date End Date Taking? Authorizing Provider  amLODipine (NORVASC) 10 MG tablet Take 1  tablet (10 mg total) by mouth daily. 11/05/17   Rolland Porter, MD  carvedilol (COREG) 25 MG tablet Take 1 tablet (25 mg total) by mouth 2 (two) times daily with a meal. 11/05/17   Rolland Porter, MD  furosemide (LASIX) 40 MG tablet Take 1 tablet (40 mg total) by mouth daily. 11/05/17   Rolland Porter, MD  losartan (COZAAR) 100 MG tablet Take 1 tablet (100 mg total) by mouth daily. 11/05/17   Rolland Porter, MD     Allergies:   No Known Allergies  Social History:  He works in a [employer]. His job entails heavy lifting. He has not been able to do this over the past 2 weeks. He lives alone. Occasionally smokes marijuana.   Social History   Socioeconomic History  . Marital status: Single    Spouse name: Not on file  . Number of children: Not on file  . Years of education: Not on file  . Highest education level: Not on file  Occupational History  . Not on file  Social Needs  . Financial resource strain: Not on file  . Food insecurity:    Worry: Not on file    Inability: Not on file  . Transportation needs:    Medical: Not on file    Non-medical: Not on file  Tobacco Use  . Smoking status: Former Games developer  . Smokeless tobacco: Never Used  Substance and Sexual Activity  . Alcohol use: Yes    Comment: occ  . Drug use: Yes  Types: Marijuana  . Sexual activity: Not on file  Lifestyle  . Physical activity:    Days per week: Not on file    Minutes per session: Not on file  . Stress: Not on file  Relationships  . Social connections:    Talks on phone: Not on file    Gets together: Not on file    Attends religious service: Not on file    Active member of club or organization: Not on file    Attends meetings of clubs or organizations: Not on file    Relationship status: Not on file  . Intimate partner violence:    Fear of current or ex partner: Not on file    Emotionally abused: Not on file    Physically abused: Not on file    Forced sexual activity: Not on file  Other Topics Concern  . Not on  file  Social History Narrative  . Not on file    Family History:   The patient's family history is not on file.    Review of Systems: [y] = yes, [ ]  = no   . General: Weight gain [ Y]; Weight loss [ ] ; Anorexia [ ] ; Fatigue [Y ]; Fever [ ] ; Chills [ ] ; Weakness [Y ]  . Cardiac: Chest pain/pressure [ ] ; Resting SOB [ ] ; Exertional SOB [Y ]; Orthopnea [ ] ; Pedal Edema [Y ]; Palpitations [ ] ; Syncope [ ] ; Presyncope [ ] ; Paroxysmal nocturnal dyspnea[Y ]  . Pulmonary: Cough [ ] ; Wheezing[ ] ; Hemoptysis[ ] ; Sputum [ ] ; Snoring [Y ]  . GI: Vomiting[ ] ; Dysphagia[ ] ; Melena[ ] ; Hematochezia [ ] ; Heartburn[ ] ; Abdominal pain [ ] ; Constipation [ ] ; Diarrhea [ ] ; BRBPR [ ]   . GU: Hematuria[ ] ; Dysuria [ ] ; Nocturia[ ]   . Vascular: Pain in legs with walking [ ] ; Pain in feet with lying flat [ ] ; Non-healing sores [ ] ; Stroke [ ] ; TIA [ ] ; Slurred speech [ ] ;  . Neuro: Headaches[ ] ; Vertigo[ ] ; Seizures[ ] ; Paresthesias[ ] ;Blurred vision [ ] ; Diplopia [ ] ; Vision changes [ ]   . Ortho/Skin: Arthritis [ ] ; Joint pain [ ] ; Muscle pain [ ] ; Joint swelling [ ] ; Back Pain [ ] ; Rash [ ]   . Psych: Depression[ ] ; Anxiety[ ]   . Heme: Bleeding problems [ ] ; Clotting disorders [ ] ; Anemia [ ]   . Endocrine: Diabetes [ ] ; Thyroid dysfunction[ ]     Physical Exam/Data:   Vitals:   04/14/18 2002 04/14/18 2100 04/14/18 2125 04/15/18 0035  BP: (!) 133/105  (!) 144/107   Pulse: 88  86 92  Resp: (!) 26  (!) 21 18  Temp:   98.1 F (36.7 C)   TempSrc:   Oral   SpO2: 96%  95% 96%  Weight:  (!) 136.4 kg (300 lb 11.2 oz)    Height:  6\' 3"  (1.905 m)      Intake/Output Summary (Last 24 hours) at 04/15/2018 0353 Last data filed at 04/14/2018 2300 Gross per 24 hour  Intake 240 ml  Output 4200 ml  Net -3960 ml   Filed Weights   04/14/18 1555 04/14/18 2100  Weight: (!) 142 kg (313 lb) (!) 136.4 kg (300 lb 11.2 oz)   Body mass index is 37.58 kg/m.  General:  Well nourished, well developed, in no acute  distress HEENT: normal Lymph: no adenopathy Neck: mild JVD Endocrine:  No thryomegaly Vascular: No carotid bruits; FA pulses 2+ bilaterally without bruits  Cardiac:  normal  S1, S2; RRR; no murmur Lungs:  minimal rales at bases Abd: soft, nontender, no hepatomegaly  Ext: 2 + edema of the lower legs bilaterally Musculoskeletal:  No deformities, BUE and BLE strength normal and equal Skin: warm and dry  Neuro:  CNs 2-12 intact, no focal abnormalities noted Psych:  Normal affect    EKG:  The ECG that was done was personally reviewed and demonstrates sinus rhythm with a left bundle branch block    Laboratory Data:  Chemistry Recent Labs  Lab 04/14/18 1636  NA 139  K 2.9*  CL 103  CO2 27  GLUCOSE 121*  BUN 8  CREATININE 0.96  CALCIUM 8.1*  GFRNONAA >60  GFRAA >60  ANIONGAP 9    No results for input(s): PROT, ALBUMIN, AST, ALT, ALKPHOS, BILITOT in the last 168 hours. Hematology Recent Labs  Lab 04/14/18 1636  WBC 7.7  RBC 4.74  HGB 11.2*  HCT 35.1*  MCV 74.1*  MCH 23.6*  MCHC 31.9  RDW 17.8*  PLT 339   Cardiac Enzymes Recent Labs  Lab 04/14/18 1636  TROPONINI 0.08*   No results for input(s): TROPIPOC in the last 168 hours.  BNP Recent Labs  Lab 04/14/18 1638  BNP 1,540.9*    DDimer No results for input(s): DDIMER in the last 168 hours.  Radiology/Studies:  Dg Chest 2 View  Result Date: 04/14/2018 CLINICAL DATA:  Shortness of breath and bilateral leg swelling for the past 2 weeks. EXAM: CHEST - 2 VIEW COMPARISON:  Chest x-ray dated November 05, 2017. FINDINGS: Stable cardiomegaly. Pulmonary vascular congestion. New hazy opacity in the right lower lobe. No pleural effusion or pneumothorax. No acute osseous abnormality. IMPRESSION: 1. New hazy opacity in the right lower lobe may reflect asymmetric edema or pneumonia. 2. Stable cardiomegaly and pulmonary vascular congestion. Electronically Signed   By: Obie Dredge M.D.   On: 04/14/2018 17:14     Assessment and Plan:   1. Acute on chronic systolic heart failure  The patient is admitted with decompensated heart failure. In the past his work up included a coronary angiogram that revealed normal coronaries. He has a known EF of 35%. He reports NYHA functional class III symptoms (and is a Stage C). On examination he is "wet" and "warm".  We will focus on appropriate diuresis with Furosemide. He has responded well to Furosemide at 80mg  IV. This can be continued at twice daily dosing with a goal of being net negative 2-3 liters in a 24 hour span.  Since his compliance to medications is unclear the carvedilol is to be started at a low dose of 3.125mg  bid. The lisinopril is at 2.5mg  daily.  Electrolytes should be repleted as needed.  2. Non-ischemic cardiomypathy  Obtain a transthoracic echocardiogram  3. Hypertension  Initial regimen to include beta-blockers and ACE-inhibitors. Avoid calcium channel blockers if the ejection fraction is low.  4. Sleep apnea  Continue CPAP  5. Non-compliance with medications  Consider evaluation by a Child psychotherapist    For questions or updates, please contact [facility] Please consult [email or web address] for contact info under Cardiology/STEMI.    Signed, Lonie Peak, MD  04/15/2018 3:53 AM

## 2018-04-15 NOTE — Progress Notes (Signed)
  Echocardiogram 2D Echocardiogram has been performed.  Jim Garcia L Androw 04/15/2018, 12:14 PM

## 2018-04-15 NOTE — Progress Notes (Signed)
Pt has arrived to the unit. VVS. CCMD notified. Pt was informed about the use of the call light. Attending MD was notified of his arrival. MD has examined him. Pt is on cpap tolerating well. Pt is resting at this time. Call light within reach. Will continue to monitor.

## 2018-04-15 NOTE — Progress Notes (Signed)
Placed pt on Cpap auto due to patient no certain of setting .  Pt tolerating well at this time.

## 2018-04-16 DIAGNOSIS — R778 Other specified abnormalities of plasma proteins: Secondary | ICD-10-CM | POA: Diagnosis present

## 2018-04-16 DIAGNOSIS — R7989 Other specified abnormal findings of blood chemistry: Secondary | ICD-10-CM | POA: Diagnosis present

## 2018-04-16 DIAGNOSIS — R748 Abnormal levels of other serum enzymes: Secondary | ICD-10-CM

## 2018-04-16 LAB — BASIC METABOLIC PANEL
Anion gap: 8 (ref 5–15)
Anion gap: 11 (ref 5–15)
BUN: 10 mg/dL (ref 6–20)
BUN: 10 mg/dL (ref 6–20)
CHLORIDE: 101 mmol/L (ref 101–111)
CHLORIDE: 97 mmol/L — AB (ref 101–111)
CO2: 32 mmol/L (ref 22–32)
CO2: 33 mmol/L — AB (ref 22–32)
CREATININE: 1.02 mg/dL (ref 0.61–1.24)
CREATININE: 1.07 mg/dL (ref 0.61–1.24)
Calcium: 8 mg/dL — ABNORMAL LOW (ref 8.9–10.3)
Calcium: 8.4 mg/dL — ABNORMAL LOW (ref 8.9–10.3)
GFR calc Af Amer: >60 mL/min (ref >60)
GFR calc non Af Amer: >60 mL/min (ref >60)
GFR calc non Af Amer: >60 mL/min (ref >60)
GLUCOSE: 112 mg/dL — AB (ref 65–99)
GLUCOSE: 127 mg/dL — AB (ref 65–99)
Potassium: 2.6 mmol/L — CL (ref 3.5–5.1)
Potassium: 3.2 mmol/L — ABNORMAL LOW (ref 3.5–5.1)
SODIUM: 141 mmol/L (ref 135–145)
Sodium: 141 mmol/L (ref 135–145)

## 2018-04-16 MED ORDER — SPIRONOLACTONE 12.5 MG HALF TABLET
12.5000 mg | ORAL_TABLET | Freq: Every day | ORAL | Status: DC
  Administered 2018-04-16 – 2018-04-19 (×4): 12.5 mg via ORAL
  Filled 2018-04-16 (×4): qty 1

## 2018-04-16 MED ORDER — POTASSIUM CHLORIDE CRYS ER 20 MEQ PO TBCR
40.0000 meq | EXTENDED_RELEASE_TABLET | Freq: Once | ORAL | Status: AC
  Administered 2018-04-16: 40 meq via ORAL
  Filled 2018-04-16: qty 2

## 2018-04-16 MED ORDER — POTASSIUM CHLORIDE CRYS ER 20 MEQ PO TBCR
40.0000 meq | EXTENDED_RELEASE_TABLET | Freq: Every day | ORAL | Status: DC
  Administered 2018-04-17 – 2018-04-19 (×3): 40 meq via ORAL
  Filled 2018-04-16 (×3): qty 2

## 2018-04-16 MED ORDER — LOSARTAN POTASSIUM 25 MG PO TABS
25.0000 mg | ORAL_TABLET | Freq: Every day | ORAL | Status: DC
  Administered 2018-04-16: 25 mg via ORAL
  Filled 2018-04-16 (×2): qty 1

## 2018-04-16 NOTE — Progress Notes (Addendum)
Progress Note  Patient Name: Jim Garcia Date of Encounter: 04/16/2018  Primary Cardiologist: No primary care provider on file.   Subjective   Denies any chest pain.  SOB improved  Inpatient Medications    Scheduled Meds: . carvedilol  6.25 mg Oral BID WC  . enoxaparin (LOVENOX) injection  40 mg Subcutaneous Daily  . furosemide  80 mg Intravenous Q12H  . lisinopril  2.5 mg Oral Daily  . sodium chloride flush  3 mL Intravenous Q12H   Continuous Infusions: . sodium chloride     PRN Meds: sodium chloride, acetaminophen, ondansetron (ZOFRAN) IV, sodium chloride flush   Vital Signs    Vitals:   04/16/18 0525 04/16/18 0737 04/16/18 0828 04/16/18 1000  BP: (!) 124/107 (!) 126/100  (!) 117/92  Pulse: 85 87 89 81  Resp: 17 18    Temp: 98.6 F (37 C) 98.6 F (37 C)    TempSrc: Oral Oral    SpO2: 96% 95%    Weight: 292 lb 1.6 oz (132.5 kg)     Height:        Intake/Output Summary (Last 24 hours) at 04/16/2018 1017 Last data filed at 04/16/2018 0820 Gross per 24 hour  Intake 924 ml  Output 3631 ml  Net -2707 ml   Filed Weights   04/14/18 2100 04/15/18 0346 04/16/18 0525  Weight: (!) 300 lb 11.2 oz (136.4 kg) 300 lb (136.1 kg) 292 lb 1.6 oz (132.5 kg)    Telemetry    NSR - Personally Reviewed  ECG    No new EKG to review- Personally Reviewed  Physical Exam   GEN: No acute distress.   Neck: No JVD Cardiac: RRR, no murmurs, rubs, or gallops.  Respiratory: scant crackles at bases GI: Soft, nontender, non-distended  MS: trace LE edema; No deformity. Neuro:  Nonfocal  Psych: Normal affect   Labs    Chemistry Recent Labs  Lab 04/14/18 1636 04/15/18 0436 04/15/18 1226 04/16/18 0352  NA 139  --  142 141  K 2.9*  --  3.4* 2.6*  CL 103  --  100* 101  CO2 27  --  31 32  GLUCOSE 121*  --  132* 112*  BUN 8  --  9 10  CREATININE 0.96 1.08 1.08 1.02  CALCIUM 8.1*  --  8.4* 8.0*  GFRNONAA >60 >60 >60 >60  GFRAA >60 >60 >60 >60  ANIONGAP 9  --  11 8     Hematology Recent Labs  Lab 04/14/18 1636  WBC 7.7  RBC 4.74  HGB 11.2*  HCT 35.1*  MCV 74.1*  MCH 23.6*  MCHC 31.9  RDW 17.8*  PLT 339    Cardiac Enzymes Recent Labs  Lab 04/14/18 1636  TROPONINI 0.08*   No results for input(s): TROPIPOC in the last 168 hours.   BNP Recent Labs  Lab 04/14/18 1638  BNP 1,540.9*     DDimer No results for input(s): DDIMER in the last 168 hours.   Radiology    Dg Chest 2 View  Result Date: 04/14/2018 CLINICAL DATA:  Shortness of breath and bilateral leg swelling for the past 2 weeks. EXAM: CHEST - 2 VIEW COMPARISON:  Chest x-ray dated November 05, 2017. FINDINGS: Stable cardiomegaly. Pulmonary vascular congestion. New hazy opacity in the right lower lobe. No pleural effusion or pneumothorax. No acute osseous abnormality. IMPRESSION: 1. New hazy opacity in the right lower lobe may reflect asymmetric edema or pneumonia. 2. Stable cardiomegaly and pulmonary vascular congestion. Electronically  Signed   By: Obie Dredge M.D.   On: 04/14/2018 17:14    Cardiac Studies   Cardiac catheterization 2016: Conclusions Diagnostic Procedure Summary There is NO obstructive CAD. 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 Severe global LV dysfunction.EF 30% Diagnostic Procedure Recommendations Medical Therapy because no interventional therapy is required. Medical therapy for LV dysfunction.  2D echo 04/15/2018 Study Conclusions  - Left ventricle: The cavity size was severely dilated. Systolic   function was severely reduced. The estimated ejection fraction   was in the range of 20% to 25%. Diffuse hypokinesis. Features are   consistent with a pseudonormal left ventricular filling pattern,   with concomitant abnormal relaxation and increased filling   pressure (grade 2 diastolic dysfunction). - Mitral valve: There was mild regurgitation. - Left atrium: The atrium was severely dilated. - Right atrium: The atrium was moderately to severely dilated. -  Tricuspid valve: There was mild-moderate regurgitation. - Pulmonary arteries: Systolic pressure was moderately increased.   PA peak pressure: 45 mm Hg (S).  Patient Profile     52 y.o. male with PMH of chronic systolic CHF (last known EF 35%), dilated cardiomyopathy, HTN, HLD, DM type 2, morbid obesity, OSA, who presented with SOB. Admitted to cardiology for acute on chronic systolic CHF.  Assessment & Plan    1. Acute on chronic systolic CHF/ NICM: presented with worsening SOB, orthopnea, and LE edema over the past 2 weeks. Has been off of all cardiac medications for the past several months.  - He was started on IV lasix 80mg  BID  -2D echocardiogram showed a decline in EF.  EF in 2016 was 35% and is now decreased to 20 to 25% with diffuse hypokinesis.  - Decline in EF likely secondary to poorly controlled hypertension due to inability to afford blood pressure medications - He put out 3.38 L yesterday and is net -8 L. - Weight is down 8 pounds from yesterday - Continue IV lasix 80mg  BID and likely change to PO tomorrow - Continue Coreg to 6.25mg  BID  - Change lisinopril to losartan 25 mg daily which will enable easier transition to Entresto - Continue to uptitrate heart failure meds as blood pressure tolerates - Creatinine stable at 1.02 and potassium is low 2.6  - Add on Spironolactone 12.5 mg daily which will also help with hypokalemia - will ask HF nurse to come given patient HF education and get scales for him to weight daily -continue fluid restriction  2. HTN: poorly controlled given financial constraints and inability to afford medications.  - Diastolic blood pressure remains elevated but improved - Continue current regimen of carvedilol 6.25 mg twice daily changing ACE inhibitor to losartan 25 mg daily. - Continue to uptitrate BP meds as needed  3. Hypokalemia: K 2.9 on arrival; repleted with 40 mEq po and 10 mEq IV potassium;  - K 2.6 today - Replete to maintain K >4 -  Monitor closely with aggressive diuresis  4.  Moderate pulmonary hypertension - PASP 45 mmHg likely related to group 2 from pulmonary venous hypertension secondary to LV dysfunction acute CHF exacerbation - Continue with aggressive diuresis  5.  Elevated trop (0.08) - likely due to demand ischemia in the setting of acute on chronic systolic CHF - similar elevation 11/2017 - he had normal coronary arteries by cath 2016  6. Social issues: patient is uninsured and has been unable to afford his medications for the last several months - Case management consult requested to discuss resources   I  have spent a total of 35 minutes with patient reviewing 2D echo , telemetry, EKGs, labs and examining patient as well as establishing an assessment and plan that was discussed with the patient.  > 50% of time was spent in direct patient care.     For questions or updates, please contact [facility] Please consult [email or web address] for contact info under Cardiology/STEMI.      Signed, Armanda Magic, MD  04/16/2018, 10:17 AM

## 2018-04-16 NOTE — Care Management Note (Addendum)
Case Management Note Donn Pierini RN, BSN Unit 4E-Case Manager [phone]  Patient Details  Name: Jim Garcia MRN: [id number] Date of Birth: 22-Aug-1966  Subjective/Objective:  Pt admitted with acute on chronic HF                  Action/Plan: PTA pt lived at home, independent, referral received for PCP and medication needs- spoke with pt at bedside regarding transition of care needs- per pt he used to go to the [facility] in [place]- but states he has not f/u there "in awhile" pt is open to coming to one of the [facility] clinics - call made to both [facility] and [facility] both do not have f/u appointments available- CM provided pt with info on both clinics and will have pt call for appointment on Monday 6/17 if discharged over weekend- other discussed medications with pt- pt reports that he uses [facility] on [address] in [place] he states that he has a discount card that he uses and that he can afford his medications that he was on prior to admission- reviewed meds that pt is currently taking in house- and they are the same with addition of one med that is $4 generic- pt states that he will be able to get his medications without difficulty if no changes made to anything more expensive. CM does not anticipate need for MATCH at this time. -provided pt provided with info on clinics in [place] along with [facility] info also given info and card for GoodRx- will follow for any further transition needs.   Expected Discharge Date:                  Expected Discharge Plan:  Home/Self Care  In-House Referral:     Discharge planning Services  CM Consult, [facility]igent [facility] Clinic  Post Acute Care Choice:    Choice offered to:     DME Arranged:    DME Agency:     HH Arranged:    HH Agency:     Status of Service:  In process, will continue to follow  If discussed at Long Length of Stay Meetings, dates discussed:    Discharge Disposition:   Additional Comments:  Darrold Span,  RN 04/16/2018, 2:11 PM

## 2018-04-16 NOTE — Progress Notes (Addendum)
CRITICAL VALUE ALERT  Critical Value:  K+ 2.6  Date & Time Notied:  6/14 0520  Provider Notified: Mayford Knife, MD  Orders Received/Actions taken: 40 meq of K+ NOW, 40 meq K+ at 1000, BMET at 1200, see MAR

## 2018-04-17 LAB — BASIC METABOLIC PANEL
ANION GAP: 7 (ref 5–15)
BUN: 14 mg/dL (ref 6–20)
CALCIUM: 8.2 mg/dL — AB (ref 8.9–10.3)
CO2: 35 mmol/L — ABNORMAL HIGH (ref 22–32)
Chloride: 98 mmol/L — ABNORMAL LOW (ref 101–111)
Creatinine, Ser: 1.02 mg/dL (ref 0.61–1.24)
GFR calc Af Amer: >60 mL/min (ref >60)
Glucose, Bld: 105 mg/dL — ABNORMAL HIGH (ref 65–99)
Potassium: 3.4 mmol/L — ABNORMAL LOW (ref 3.5–5.1)
SODIUM: 140 mmol/L (ref 135–145)

## 2018-04-17 MED ORDER — POTASSIUM CHLORIDE CRYS ER 20 MEQ PO TBCR
40.0000 meq | EXTENDED_RELEASE_TABLET | Freq: Once | ORAL | Status: AC
  Administered 2018-04-17: 40 meq via ORAL
  Filled 2018-04-17: qty 2

## 2018-04-17 MED ORDER — LOSARTAN POTASSIUM 50 MG PO TABS
50.0000 mg | ORAL_TABLET | Freq: Every day | ORAL | Status: DC
  Administered 2018-04-17 – 2018-04-19 (×3): 50 mg via ORAL
  Filled 2018-04-17 (×3): qty 1

## 2018-04-17 NOTE — Plan of Care (Signed)
Nutrition Education Note  RD consulted for nutrition education regarding CHF. Pt with additional hx of obesity, HTN, OSA  RD provided "Heart Failure Nutrition Therapy" handout from the Academy of [employer]etics. Reviewed patient's dietary recall. Provided examples on ways to decrease sodium intake in diet. Discouraged intake of processed foods and use of salt shaker. Encouraged fresh fruits and vegetables as well as whole grain sources of carbohydrates to maximize fiber intake.   RD discussed why it is important for patient to adhere to diet recommendations, and emphasized the role of fluids (including providing fluid nutrition therapy handout and discussing fluid restriction), foods to avoid, and importance of weighing self daily. Reviewed basic label reading including choosing foods low in sodium or with no added salt. Teach back method used.  Pt verbalizes not taking his heart medications, including lasix, PTA. Pt verbalizes his plans to be compliant with meds at discharge; pt plans to discuss his fluid restriction with MD and whether this will be required at discharge.   Expect good compliance. Pt appears ready to change  Body mass index is 35.66 kg/m. Pt meets criteria for obesity unspecified based on current BMI.  Current diet order is 2g sodium with 1500 mL fluid restriction, patient reports very good appetite currently and PTA. Patient is consuming approximately 75-100% of meals at this time. Labs and medications reviewed. No further nutrition interventions warranted at this time. RD contact information provided. If additional nutrition issues arise, please re-consult RD.   Romelle Starcher MS, RD, LDN, CNSC [phone] Pager  [phone] Weekend/On-Call Pager

## 2018-04-17 NOTE — Progress Notes (Signed)
Progress Note  Patient Name: Jim Garcia Date of Encounter: 04/17/2018  Primary Cardiologist:   No primary care provider on file.   Subjective   Feels better.  No pain.  Breathing is better.   Inpatient Medications    Scheduled Meds: . carvedilol  6.25 mg Oral BID WC  . enoxaparin (LOVENOX) injection  40 mg Subcutaneous Daily  . furosemide  80 mg Intravenous Q12H  . losartan  25 mg Oral Daily  . potassium chloride  40 mEq Oral Daily  . sodium chloride flush  3 mL Intravenous Q12H  . spironolactone  12.5 mg Oral Daily   Continuous Infusions: . sodium chloride     PRN Meds: sodium chloride, acetaminophen, ondansetron (ZOFRAN) IV, sodium chloride flush   Vital Signs    Vitals:   04/16/18 2134 04/17/18 0559 04/17/18 0601 04/17/18 0800  BP: (!) 123/100  (!) 133/91 131/87  Pulse: 97  80 75  Resp: (!) 30  19   Temp: 98.3 F (36.8 C)  98.3 F (36.8 C)   TempSrc: Oral  Oral   SpO2: 98%  95%   Weight:  285 lb 4.8 oz (129.4 kg)    Height:        Intake/Output Summary (Last 24 hours) at 04/17/2018 0849 Last data filed at 04/17/2018 0714 Gross per 24 hour  Intake 670 ml  Output 2380 ml  Net -1710 ml   Filed Weights   04/15/18 0346 04/16/18 0525 04/17/18 0559  Weight: 300 lb (136.1 kg) 292 lb 1.6 oz (132.5 kg) 285 lb 4.8 oz (129.4 kg)    Telemetry    NSR, PVCs - Personally Reviewed  ECG    NA - Personally Reviewed  Physical Exam   GEN: No acute distress.   Neck: No  JVD Cardiac: RRR, no murmurs, rubs, or gallops.  Respiratory: Clear  to auscultation bilaterally. GI: Soft, nontender, non-distended  MS:   Mild edema; No deformity. Neuro:  Nonfocal  Psych: Normal affect   Labs    Chemistry Recent Labs  Lab 04/16/18 0352 04/16/18 1200 04/17/18 0238  NA 141 141 140  K 2.6* 3.2* 3.4*  CL 101 97* 98*  CO2 32 33* 35*  GLUCOSE 112* 127* 105*  BUN 10 10 14   CREATININE 1.02 1.07 4.09  CALCIUM 8.0* 8.4* 8.2*  GFRNONAA >60 >60 >60  GFRAA >60 >60  >60  ANIONGAP 8 11 7      Hematology Recent Labs  Lab 04/14/18 1636  WBC 7.7  RBC 4.74  HGB 11.2*  HCT 35.1*  MCV 74.1*  MCH 23.6*  MCHC 31.9  RDW 17.8*  PLT 339    Cardiac Enzymes Recent Labs  Lab 04/14/18 1636  TROPONINI 0.08*   No results for input(s): TROPIPOC in the last 168 hours.   BNP Recent Labs  Lab 04/14/18 1638  BNP 1,540.9*     DDimer No results for input(s): DDIMER in the last 168 hours.   Radiology    No results found.  Cardiac Studies   ECHO:    Study Conclusions  - Left ventricle: The cavity size was severely dilated. Systolic   function was severely reduced. The estimated ejection fraction   was in the range of 20% to 25%. Diffuse hypokinesis. Features are   consistent with a pseudonormal left ventricular filling pattern,   with concomitant abnormal relaxation and increased filling   pressure (grade 2 diastolic dysfunction). - Mitral valve: There was mild regurgitation. - Left atrium: The atrium was severely  dilated. - Right atrium: The atrium was moderately to severely dilated. - Tricuspid valve: There was mild-moderate regurgitation. - Pulmonary arteries: Systolic pressure was moderately increased.   PA peak pressure: 45 mm Hg (S).   Patient Profile     52 y.o. male with PMH of chronic systolic CHF (last known EF 35%), dilated cardiomyopathy, HTN, HLD, DM type 2, morbid obesity, OSA, who presented with SOB. Admitted to cardiology for acute on chronic systolic CHF.   Assessment & Plan    ACUTE ON CHRONIC SYSTOLIC HF:  Down 9 liters since admission.  Weight down about 23 lbs.   Continue IV diuresis today.    HTN:  This is being managed in the context of treating his CHF.  Will increase Cozaar  HYPOKALEMIA:    Will supplement.  Give an extra 40 meq today in addition to scheduled.   For questions or updates, please contact [facility] Please consult [email or web address] for contact info under Cardiology/STEMI.   Signed, Rollene Rotunda, MD  04/17/2018, 8:49 AM

## 2018-04-18 LAB — BASIC METABOLIC PANEL
ANION GAP: 8 (ref 5–15)
BUN: 13 mg/dL (ref 6–20)
CALCIUM: 8.1 mg/dL — AB (ref 8.9–10.3)
CO2: 34 mmol/L — ABNORMAL HIGH (ref 22–32)
CREATININE: 1.1 mg/dL (ref 0.61–1.24)
Chloride: 98 mmol/L — ABNORMAL LOW (ref 101–111)
GFR calc Af Amer: >60 mL/min (ref >60)
GLUCOSE: 111 mg/dL — AB (ref 65–99)
Potassium: 3.7 mmol/L (ref 3.5–5.1)
Sodium: 140 mmol/L (ref 135–145)

## 2018-04-18 MED ORDER — CARVEDILOL 12.5 MG PO TABS
12.5000 mg | ORAL_TABLET | Freq: Two times a day (BID) | ORAL | Status: DC
  Administered 2018-04-18 – 2018-04-20 (×4): 12.5 mg via ORAL
  Filled 2018-04-18 (×4): qty 1

## 2018-04-18 NOTE — Plan of Care (Signed)

## 2018-04-18 NOTE — Progress Notes (Signed)
Progress Note  Patient Name: Jim Garcia Date of Encounter: 04/18/2018  Primary Cardiologist:   No primary care provider on file.   Subjective   Feeling better.  Good UO.  Breathing back almost to baseline.   Inpatient Medications    Scheduled Meds: . carvedilol  6.25 mg Oral BID WC  . enoxaparin (LOVENOX) injection  40 mg Subcutaneous Daily  . furosemide  80 mg Intravenous Q12H  . losartan  50 mg Oral Daily  . potassium chloride  40 mEq Oral Daily  . sodium chloride flush  3 mL Intravenous Q12H  . spironolactone  12.5 mg Oral Daily   Continuous Infusions: . sodium chloride     PRN Meds: sodium chloride, acetaminophen, ondansetron (ZOFRAN) IV, sodium chloride flush   Vital Signs    Vitals:   04/17/18 1605 04/17/18 2145 04/18/18 0535 04/18/18 0848  BP: 106/75 (!) 130/94 123/90 (!) 139/93  Pulse: 77 82 73 83  Resp:  16 18   Temp:  98 F (36.7 C) 97.9 F (36.6 C)   TempSrc:  Oral Oral   SpO2:  96% 94%   Weight:   280 lb 3.2 oz (127.1 kg)   Height:        Intake/Output Summary (Last 24 hours) at 04/18/2018 0936 Last data filed at 04/18/2018 0831 Gross per 24 hour  Intake 517 ml  Output 4550 ml  Net -4033 ml   Filed Weights   04/16/18 0525 04/17/18 0559 04/18/18 0535  Weight: 292 lb 1.6 oz (132.5 kg) 285 lb 4.8 oz (129.4 kg) 280 lb 3.2 oz (127.1 kg)    Telemetry    NSR, PVCs, NSVT short runs - Personally Reviewed  ECG    NA - Personally Reviewed  Physical Exam   GEN: No  acute distress.   Neck: No  JVD Cardiac: RRR, no murmurs, rubs, or gallops.  Respiratory: Clear   to auscultation bilaterally. GI: Soft, nontender, non-distended, normal bowel sounds  MS:  Moderate edema; No deformity. Neuro:   Nonfocal  Psych: Oriented and appropriate    Labs    Chemistry Recent Labs  Lab 04/16/18 1200 04/17/18 0238 04/18/18 0249  NA 141 140 140  K 3.2* 3.4* 3.7  CL 97* 98* 98*  CO2 33* 35* 34*  GLUCOSE 127* 105* 111*  BUN 10 14 13   CREATININE  1.07 1.02 1.10  CALCIUM 8.4* 8.2* 8.1*  GFRNONAA >60 >60 >60  GFRAA >60 >60 >60  ANIONGAP 11 7 8      Hematology Recent Labs  Lab 04/14/18 1636  WBC 7.7  RBC 4.74  HGB 11.2*  HCT 35.1*  MCV 74.1*  MCH 23.6*  MCHC 31.9  RDW 17.8*  PLT 339    Cardiac Enzymes Recent Labs  Lab 04/14/18 1636  TROPONINI 0.08*   No results for input(s): TROPIPOC in the last 168 hours.   BNP Recent Labs  Lab 04/14/18 1638  BNP 1,540.9*     DDimer No results for input(s): DDIMER in the last 168 hours.   Radiology    No results found.  Cardiac Studies   ECHO:    Study Conclusions  - Left ventricle: The cavity size was severely dilated. Systolic   function was severely reduced. The estimated ejection fraction   was in the range of 20% to 25%. Diffuse hypokinesis. Features are   consistent with a pseudonormal left ventricular filling pattern,   with concomitant abnormal relaxation and increased filling   pressure (grade 2 diastolic dysfunction). -  Mitral valve: There was mild regurgitation. - Left atrium: The atrium was severely dilated. - Right atrium: The atrium was moderately to severely dilated. - Tricuspid valve: There was mild-moderate regurgitation. - Pulmonary arteries: Systolic pressure was moderately increased.   PA peak pressure: 45 mm Hg (S).   Patient Profile     52 y.o. male with PMH of chronic systolic CHF (last known EF 35%), dilated cardiomyopathy, HTN, HLD, DM type 2, morbid obesity, OSA, who presented with SOB. Admitted to cardiology for acute on chronic systolic CHF.   Assessment & Plan    ACUTE ON CHRONIC SYSTOLIC HF:  Down 12.8 liters since admission.  Weight down about 33 lbs.   Continue IV diuresis today.  Increase the Coreg today.  Change to PO diuretic and possibly home in AM.   HTN:   Cozaar increased yesterday.  Increase beta blocker as above.    HYPOKALEMIA:    Supplemented.    For questions or updates, please contact [facility] Please  consult [email or web address] for contact info under Cardiology/STEMI.   Signed, Rollene Rotunda, MD  04/18/2018, 9:36 AM

## 2018-04-19 DIAGNOSIS — G4733 Obstructive sleep apnea (adult) (pediatric): Secondary | ICD-10-CM

## 2018-04-19 DIAGNOSIS — I5043 Acute on chronic combined systolic (congestive) and diastolic (congestive) heart failure: Secondary | ICD-10-CM

## 2018-04-19 LAB — BASIC METABOLIC PANEL
ANION GAP: 8 (ref 5–15)
BUN: 12 mg/dL (ref 6–20)
CO2: 35 mmol/L — ABNORMAL HIGH (ref 22–32)
Calcium: 8.3 mg/dL — ABNORMAL LOW (ref 8.9–10.3)
Chloride: 98 mmol/L — ABNORMAL LOW (ref 101–111)
Creatinine, Ser: 1.01 mg/dL (ref 0.61–1.24)
GLUCOSE: 109 mg/dL — AB (ref 65–99)
POTASSIUM: 3 mmol/L — AB (ref 3.5–5.1)
Sodium: 141 mmol/L (ref 135–145)

## 2018-04-19 MED ORDER — FUROSEMIDE 80 MG PO TABS
80.0000 mg | ORAL_TABLET | Freq: Every day | ORAL | Status: DC
  Administered 2018-04-20: 80 mg via ORAL
  Filled 2018-04-19: qty 1

## 2018-04-19 MED ORDER — SACUBITRIL-VALSARTAN 49-51 MG PO TABS
1.0000 | ORAL_TABLET | Freq: Two times a day (BID) | ORAL | Status: DC
  Administered 2018-04-20: 1 via ORAL
  Filled 2018-04-19 (×2): qty 1

## 2018-04-19 MED ORDER — SPIRONOLACTONE 25 MG PO TABS
25.0000 mg | ORAL_TABLET | Freq: Every day | ORAL | Status: DC
  Administered 2018-04-20: 25 mg via ORAL
  Filled 2018-04-19: qty 1

## 2018-04-19 MED ORDER — POTASSIUM CHLORIDE CRYS ER 20 MEQ PO TBCR
40.0000 meq | EXTENDED_RELEASE_TABLET | Freq: Two times a day (BID) | ORAL | Status: DC
  Administered 2018-04-20 (×2): 40 meq via ORAL
  Filled 2018-04-19 (×2): qty 2

## 2018-04-19 NOTE — Progress Notes (Signed)
Patient has home CPAP unit at bedside.  Able to place himself on/off as needed.

## 2018-04-19 NOTE — Care Management Note (Addendum)
Case Management Note  Patient Details  Name: Jim Garcia MRN: [id number] Date of Birth: 08-Jul-1966  Subjective/Objective: Pt presented for Acute on Chronic Congestive Heart Failure. PTA from home-no insurance on file. 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 Pt states that he is able to afford medications.                   Action/Plan: List of Clinics provided to patient in [place] and [employer]. Pt to call to set up appointment. CM did reach out to Financial Counselor to see if they could see if pt is eligible for Disability. No further needs from CM at this time.   Expected Discharge Date:                  Expected Discharge Plan:  Home/Self Care  In-House Referral:  NA  Discharge planning Services  CM Consult, Indigent Health Clinic(List provided to patient for local clinics)  Post Acute Care Choice:  NA Choice offered to:  NA  DME Arranged:  N/A DME Agency:  NA  HH Arranged:  NA HH Agency:  NA  Status of Service:  Completed, signed off  If discussed at Long Length of Stay Meetings, dates discussed:    Additional Comments: 1029 04-20-18 Tomi Bamberger, RN,BSN [phone] MATCH will not be utilized for this patient. Patient uses [facility]- Medications are mostly generic and patient can afford. CM will provide pt with the Entresto 30 day free card. Pt will need to call [facility] on back of card for additional assistance. Patient Assistance Application will be placed on Shadow Chart and MD will need to sign. CM did call [facility] and Sherryll Burger is available at the location the patient utilizes. Pt is aware. No further needs from CM at this time. Gala Lewandowsky, RN 04/19/2018, 4:35 PM

## 2018-04-19 NOTE — Progress Notes (Signed)
Patient is very concerned about his health. He is the big teddy bear everyone else leans on for support.  He is regretful for not taking better care of himself and upset that he thought his medical coverage had ended and missed some care due to that.  He wants to do everything possible to help his health situation get better.  We had great visit and he hopes to go home tomorrow.  He also mentioned cousin 50 yr old named Lawanna Kobus needing a heart transplant.  Had prayers for him and his health and his cousin also.  Prayers for continued support and strength through this time of healing.  He could not say enough good things about the wonderful staff who has been there for him.  He is VERY thankful for everything staff has done for him.  He has gotten great treatment and care during his time here. Phebe Colla, Chaplain   04/19/18 1600  Clinical Encounter Type  Visited With Patient;Family (sister Crestline)  Visit Type Initial;Spiritual support  Referral From Nurse  Consult/Referral To Chaplain  Spiritual Encounters  Spiritual Needs Prayer;Emotional  Stress Factors  Patient Stress Factors Exhausted;Health changes  Family Stress Factors Exhausted

## 2018-04-19 NOTE — Progress Notes (Signed)
Patient ambulated in hallway approximately 600 feet without difficulty.  Will continue to monitor.

## 2018-04-19 NOTE — Progress Notes (Signed)
Progress Note  Patient Name: Jim Garcia Date of Encounter: 04/19/2018  Primary Cardiologist:   No primary care provider on file. -We will follow-up with  [facility] [place] office   Subjective   Feels good.  No shortness of breath, edema improved.  Has yet to ambulate in the hallway.  Inpatient Medications    Scheduled Meds: . carvedilol  12.5 mg Oral BID WC  . enoxaparin (LOVENOX) injection  40 mg Subcutaneous Daily  . furosemide  80 mg Intravenous Q12H  . losartan  50 mg Oral Daily  . [START ON 04/20/2018] potassium chloride  40 mEq Oral BID  . sodium chloride flush  3 mL Intravenous Q12H  . [START ON 04/20/2018] spironolactone  25 mg Oral Daily   Continuous Infusions: . sodium chloride     PRN Meds: sodium chloride, acetaminophen, ondansetron (ZOFRAN) IV, sodium chloride flush   Vital Signs    Vitals:   04/18/18 1817 04/18/18 1935 04/19/18 0537 04/19/18 0538  BP: (!) 114/105 (!) 122/107 (!) 140/100   Pulse: 80 79    Resp:  16 15 19   Temp:  97.6 F (36.4 C) 97.6 F (36.4 C)   TempSrc:  Oral Oral   SpO2:  96% 98%   Weight:   275 lb 6.4 oz (124.9 kg)   Height:        Intake/Output Summary (Last 24 hours) at 04/19/2018 1305 Last data filed at 04/19/2018 1218 Gross per 24 hour  Intake 800 ml  Output 3331 ml  Net -2531 ml   Filed Weights   04/17/18 0559 04/18/18 0535 04/19/18 0537  Weight: 285 lb 4.8 oz (129.4 kg) 280 lb 3.2 oz (127.1 kg) 275 lb 6.4 oz (124.9 kg)    Telemetry    Sinus rhythm with rates in the 70s to 80s.  Frequent PVCs often in trigeminy- Personally Reviewed  ECG    NA - Personally Reviewed  Physical Exam    Physical Exam  Constitutional: He is oriented to person, place, and time. He appears well-developed and well-nourished. No distress.  Morbidly obese.  Well-groomed.  Healthy-appearing  HENT:  Head: Normocephalic and atraumatic.  Neck: No JVD (Mild HJR) present.  Pulmonary/Chest: Effort normal. No respiratory  distress. He exhibits no tenderness.  Decreased breath sounds bilaterally, but no wheezes rales or rhonchi.  Abdominal: Soft. Bowel sounds are normal. He exhibits no distension. There is no tenderness. There is no rebound.  No HSM  Musculoskeletal: Normal range of motion. He exhibits edema (Trivial).  Neurological: He is alert and oriented to person, place, and time.  Skin: He is not diaphoretic.  Psychiatric: He has a normal mood and affect. His behavior is normal. Judgment and thought content normal.     Labs    Chemistry Recent Labs  Lab 04/17/18 0238 04/18/18 0249 04/19/18 0353  NA 140 140 141  K 3.4* 3.7 3.0*  CL 98* 98* 98*  CO2 35* 34* 35*  GLUCOSE 105* 111* 109*  BUN 14 13 12   CREATININE 1.02 1.10 1.01  CALCIUM 8.2* 8.1* 8.3*  GFRNONAA >60 >60 >60  GFRAA >60 >60 >60  ANIONGAP 7 8 8      Hematology Recent Labs  Lab 04/14/18 1636  WBC 7.7  RBC 4.74  HGB 11.2*  HCT 35.1*  MCV 74.1*  MCH 23.6*  MCHC 31.9  RDW 17.8*  PLT 339    Cardiac Enzymes Recent Labs  Lab 04/14/18 1636  TROPONINI 0.08*   No results for input(s): TROPIPOC in  the last 168 hours.   BNP Recent Labs  Lab 04/14/18 1638  BNP 1,540.9*     DDimer No results for input(s): DDIMER in the last 168 hours.   Radiology    No results found.  Cardiac Studies   ECHO: Severely reduced EF 20-25% with diffuse HK.  GRII DD.  Mild MR.  Severe LA dilation.  Moderate to severe RA dilation.  Mild to moderate TR.  Moderately increased PA pressures (45 mmHg)  Patient Profile     52 y.o. male with PMH of chronic systolic CHF (last known EF 35%), dilated cardiomyopathy, HTN, HLD, DM type 2, morbid obesity, OSA, who presented with SOB. Admitted to cardiology for acute on chronic combined systolic and diastolic CHF. Treated with IV diuresis with brisk effect   Assessment & Plan    Principal Problem:   Acute on chronic combined systolic and diastolic CHF (congestive heart failure) (HCC) Active  Problems:   Sleep apnea, obstructive   Non-ischemic cardiomyopathy (HCC)   Essential hypertension   Hypokalemia   Elevated troponin  ACUTE ON CHRONIC COMBINED SYSTOLIC AND DIASTOLIC HEART FAILURE/NONISCHEMIC CARDIOMYOPATHY  Net out close to 18 L this hospitalization. -38 pounds --likely nearing dry weight.  Complete IV Lasix today and switch to oral tomorrow we will need to assess urine output with oral Lasix.  Increase spironolactone to 25 mg daily along  Currently on 50 mg losartan -would like to consider converting to [facility] (case manager consult)  On carvedilol 12.5 mg twice daily -can likely titrate up to 25 mg twice daily after starting Entresto (49-51)  HTN:   ARB increased over the weekend.  Beta-blocker also recently increased.    Still not quite adequate.  Increase spironolactone 25 mg daily.  After converting to [facility], consider further titrating beta-blocker.  Elevated troponin in the setting of CHF exacerbation = demand ischemia.  Not consistent with ACS.  HYPOKALEMIA:    .  Still low today with IV Lasix will increase to 40 mg twice daily supplementation  OSA, continue CPAP  He is asking lots of questions about discharge with his heart failure diagnosis.  He is concerned about his ability to continue working with his current job.  He is asking about disability.  We are asking case manager to see reference starting Entresto, but will also defer to his PCP and primary cardiologist in the outpatient setting to discuss short-term versus long-term disability etc.   For questions or updates, please contact [facility] Please consult [email or web address] for contact info under Cardiology/STEMI.   Signed, Bryan Lemma, MD  04/19/2018, 1:05 PM

## 2018-04-20 LAB — BASIC METABOLIC PANEL
Anion gap: 10 (ref 5–15)
BUN: 12 mg/dL (ref 6–20)
CALCIUM: 8.8 mg/dL — AB (ref 8.9–10.3)
CO2: 31 mmol/L (ref 22–32)
CREATININE: 1.08 mg/dL (ref 0.61–1.24)
Chloride: 98 mmol/L — ABNORMAL LOW (ref 101–111)
GFR calc Af Amer: >60 mL/min (ref >60)
GFR calc non Af Amer: >60 mL/min (ref >60)
GLUCOSE: 110 mg/dL — AB (ref 65–99)
Potassium: 3.1 mmol/L — ABNORMAL LOW (ref 3.5–5.1)
Sodium: 139 mmol/L (ref 135–145)

## 2018-04-20 MED ORDER — SACUBITRIL-VALSARTAN 49-51 MG PO TABS: 1.0000 | ORAL_TABLET | Freq: Two times a day (BID) | ORAL | 6 refills | Status: DC

## 2018-04-20 MED ORDER — FUROSEMIDE 80 MG PO TABS: 80.0000 mg | ORAL_TABLET | Freq: Every day | ORAL | 3 refills | Status: DC

## 2018-04-20 MED ORDER — SPIRONOLACTONE 25 MG PO TABS: 25.0000 mg | ORAL_TABLET | Freq: Every day | ORAL | 3 refills | Status: DC

## 2018-04-20 MED ORDER — POTASSIUM CHLORIDE CRYS ER 20 MEQ PO TBCR: 40.0000 meq | EXTENDED_RELEASE_TABLET | Freq: Every day | ORAL | 3 refills | Status: DC

## 2018-04-20 MED ORDER — POTASSIUM CHLORIDE CRYS ER 20 MEQ PO TBCR
40.0000 meq | EXTENDED_RELEASE_TABLET | Freq: Once | ORAL | Status: AC
  Administered 2018-04-20: 40 meq via ORAL
  Filled 2018-04-20: qty 2

## 2018-04-20 MED ORDER — CARVEDILOL 12.5 MG PO TABS: 12.5000 mg | ORAL_TABLET | Freq: Two times a day (BID) | ORAL | 3 refills | Status: DC

## 2018-04-20 NOTE — Discharge Summary (Addendum)
Discharge Summary    Patient ID: Jim Garcia,  MRN: [id number], DOB/AGE: [birth year] 52 y.o.  Admit date: 04/14/2018 Discharge date: 04/20/2018  Primary Care Provider: System, Pcp Not In Primary Cardiologist: Jim Brothers, MD  Discharge Diagnoses    Principal Problem:   Acute on chronic combined systolic and diastolic CHF, NYHA class 3 (HCC) Active Problems:   Sleep apnea, obstructive   Non-ischemic cardiomyopathy (HCC)   Essential hypertension   Hypokalemia   Elevated troponin   Allergies No Known Allergies  Diagnostic Studies/Procedures    Echo 04/15/18: Study Conclusions - Left ventricle: The cavity size was severely dilated. Systolic   function was severely reduced. The estimated ejection fraction   was in the range of 20% to 25%. Diffuse hypokinesis. Features are   consistent with a pseudonormal left ventricular filling pattern,   with concomitant abnormal relaxation and increased filling   pressure (grade 2 diastolic dysfunction). - Mitral valve: There was mild regurgitation. - Left atrium: The atrium was severely dilated. - Right atrium: The atrium was moderately to severely dilated. - Tricuspid valve: There was mild-moderate regurgitation. - Pulmonary arteries: Systolic pressure was moderately increased.   PA peak pressure: 45 mm Hg (S).    History of Present Illness     Jim Garcia is a 52 year old African American gentleman with a past medical history significant for non-ischemic cardiomyopathy, obesity, obstructive sleep apnea and hypertension who is admitted to the hospital for worsening shortness of breath and leg edema for the past 2 weeks.  He has not been able to take his medications regularly for the past 1-2 years due to loss of health insurance coverage. He was last seen in the ED for CHF exacerbation in January 2019. He was sent home with a few pills that he never renewed. He has therefore been off of heart failure therapy for 4 months. He has  had access to some diuretic pills that he has taken infrequently.  He denies any chest pain. His "dry weight" is around 264 lbs. Currently he weighs >300 lbs.  He has noticed significant swelling in his lower legs. He also complains of occasional PND. He denies any orthopnea.   In the past (more than 3 year ago) he followed up with Dr. Rachael Garcia in [place], [place]. He had a a cardiac catheterization in 2016 that revealed normal coronary arteries.  He denies excessive alcohol use. He has never used cocaine. He occasionally smokes Marijuana.  He is also not taking his blood pressure medications. He is supposed to be on Carvedilol, Amlodipine and Losartan.  Hospital Course     Consultants: none  Acute on chronic systolic and diastolic heart failure, untreated hypertension, hypokalemia Patient was admitted to cardiology for management of acute on chronic combined systolic and diastolic heart failure. BNP was 1541. Echo this admission with LVEF of 20-25% and grade 2 DD, which is a decline since echo in 2016 (EF was 35%). Decline is thought to be due to uncontrolled hypertensions as he could not afford medications.  He was diuresed with IV lasix and transitioned to PO lasix. On day of discharge, he is overall net negative 19L with 2.8 L urine output yesterday on 80 mg PO lasix. His weight on day of discharge is is 270 lbs, from 313 lbs on admission. Creatinine remains stable at 1.08, K is 3.1 on 40 mg Eq kdur. Replaced with extra PO dose prior to discharge. Coreg, losartan, and spironolactone titrated. Transitioned from losartan  to entresto this admission for NYHA class III.  Titrate as tolerated as outpatient.   Elevated troponin Elevated troponin this admission (0.08) thought to be due to demand ischemia in the setting of heart failure exacerbation.    Moderate pulmonary hypertension PADP 45 mmHg, pulmonary venous hypertension secondary to LV dysfunction.    Medication management Care  management consulted for medication affordability.   _____________  Discharge Vitals Blood pressure (!) 140/101, pulse 81, temperature 97.7 F (36.5 C), temperature source Oral, resp. rate 16, height 6\' 3"  (1.905 m), weight 270 lb (122.5 kg), SpO2 96 %.  Filed Weights   04/18/18 0535 04/19/18 0537 04/20/18 0446  Weight: 280 lb 3.2 oz (127.1 kg) 275 lb 6.4 oz (124.9 kg) 270 lb (122.5 kg)   Constitutional: ANO x3.  Well-nourished well-groomed.  Walking in the hallway without problems.  Morbidly obese.  Well-groomed.  Healthy-appearing  HENT: [place]/AT EOMI. Neck:  No JVD or HJR Pulmonary/Chest:  CTA B, nonlabored, good air movement   Cardiac: RRR, normal S1-S2.  No M/R/G.  Trivial edema. Abdominal:  Soft/NT/ND/NABS.  No HSM.   Psychiatric: He has a normal mood and affect. His behavior is normal. Judgment and thought content normal.    Labs & Radiologic Studies    CBC No results for input(s): WBC, NEUTROABS, HGB, HCT, MCV, PLT in the last 72 hours. Basic Metabolic Panel Recent Labs    16/10/96 0353 04/20/18 0339  NA 141 139  K 3.0* 3.1*  CL 98* 98*  CO2 35* 31  GLUCOSE 109* 110*  BUN 12 12  CREATININE 1.01 1.08  CALCIUM 8.3* 8.8*   Liver Function Tests No results for input(s): AST, ALT, ALKPHOS, BILITOT, PROT, ALBUMIN in the last 72 hours. No results for input(s): LIPASE, AMYLASE in the last 72 hours. Cardiac Enzymes No results for input(s): CKTOTAL, CKMB, CKMBINDEX, TROPONINI in the last 72 hours. BNP Invalid input(s): POCBNP D-Dimer No results for input(s): DDIMER in the last 72 hours. Hemoglobin A1C No results for input(s): HGBA1C in the last 72 hours. Fasting Lipid Panel No results for input(s): CHOL, HDL, LDLCALC, TRIG, CHOLHDL, LDLDIRECT in the last 72 hours. Thyroid Function Tests No results for input(s): TSH, T4TOTAL, T3FREE, THYROIDAB in the last 72 hours.  Invalid input(s): FREET3 _____________  Dg Chest 2 View  Result Date: 04/14/2018 CLINICAL DATA:   Shortness of breath and bilateral leg swelling for the past 2 weeks. EXAM: CHEST - 2 VIEW COMPARISON:  Chest x-ray dated November 05, 2017. FINDINGS: Stable cardiomegaly. Pulmonary vascular congestion. New hazy opacity in the right lower lobe. No pleural effusion or pneumothorax. No acute osseous abnormality. IMPRESSION: 1. New hazy opacity in the right lower lobe may reflect asymmetric edema or pneumonia. 2. Stable cardiomegaly and pulmonary vascular congestion. Electronically Signed   By: Obie Dredge M.D.   On: 04/14/2018 17:14   Disposition   Pt is being discharged home today in good condition.  Follow-up Plans & Appointments    Follow-up Information    [facility]. Call.   Why:  Please call on 6/18 in am and request f/u appointment to establish care at clinic-  Contact information: [address] [place] [address] [phone]       Revankar, Aundra Dubin, MD Follow up on 04/27/2018.   Specialty:  Cardiology Why:  9:00 am for [facility] Contact information: [address] [place]  [place] [address] [phone]  Discharge Instructions    Diet - low sodium heart healthy   Complete by:  As directed    Discharge instructions   Complete by:  As directed    Sliding scale Lasix: Weigh yourself when you get home, then Daily in the Morning. Your dry weight will be what your scale says on the day you return home.(here is 270 lbs.).  If you gain more than 3 pounds from dry weight: Increase the Lasix dosing to 80 mg in the morning and 40 mg in the afternoon until weight returns to baseline dry weight. Take an extra 1/2 (20 mEq). If weight gain is greater than 5 pounds in 2 days: Contact the office for further assistance.  If the weight goes down more than 3 pounds from dry weight: Hold Lasix until it returns to baseline dry weight   Increase activity slowly   Complete by:  As directed       Discharge Medications    Allergies as of 04/20/2018   No Known Allergies     Medication List    STOP taking these medications   amLODipine 10 MG tablet Commonly known as:  NORVASC   losartan 100 MG tablet Commonly known as:  COZAAR     TAKE these medications   carvedilol 12.5 MG tablet Commonly known as:  COREG Take 1 tablet (12.5 mg total) by mouth 2 (two) times daily with a meal. What changed:    medication strength  how much to take   furosemide 80 MG tablet Commonly known as:  LASIX Take 1 tablet (80 mg total) by mouth daily. What changed:    medication strength  how much to take   potassium chloride SA 20 MEQ tablet Commonly known as:  K-DUR,KLOR-CON Take 2 tablets (40 mEq total) by mouth daily.   sacubitril-valsartan 49-51 MG Commonly known as:  ENTRESTO Take 1 tablet by mouth 2 (two) times daily.   spironolactone 25 MG tablet Commonly known as:  ALDACTONE Take 1 tablet (25 mg total) by mouth daily. Start taking on:  04/21/2018        Acute coronary syndrome (MI, NSTEMI, STEMI, etc) this admission?: No.    Outstanding Labs/Studies   BMP in 1 week at OV follow up  Duration of Discharge Encounter   Greater than 30 minutes including physician time.  Signed, Roe Rutherford Duke Roe Rutherford Duke, [place] 04/20/2018, 11:28 AM  I have seen, examined and evaluated the patient this AM along with Bettina Gavia, PA-C.  After reviewing all the available data and chart, we discussed the patients laboratory, study & physical findings as well as symptoms in detail. I agree with her findings, examination and DC summary recommendations as per our discussion.    Stable for discharge as directed.  Bryan Lemma, M.D., M.S. Interventional Cardiologist   Pager # [phone] Phone # [phone] [address] [place], [place] [address]

## 2018-04-20 NOTE — Progress Notes (Signed)
IV and telemetry discontinued. CCMD notified. Discharge instructions provided and all questions answered. Patient verbalized understanding.   Ernestina Columbia, RN

## 2018-04-27 ENCOUNTER — Ambulatory Visit (INDEPENDENT_AMBULATORY_CARE_PROVIDER_SITE_OTHER): Payer: Self-pay | Admitting: Cardiology

## 2018-04-27 ENCOUNTER — Encounter: Payer: Self-pay | Admitting: Cardiology

## 2018-04-27 VITALS — BP 132/86 | HR 89 | Ht 75.0 in | Wt 268.0 lb

## 2018-04-27 DIAGNOSIS — I428 Other cardiomyopathies: Secondary | ICD-10-CM

## 2018-04-27 DIAGNOSIS — G4733 Obstructive sleep apnea (adult) (pediatric): Secondary | ICD-10-CM

## 2018-04-27 DIAGNOSIS — I10 Essential (primary) hypertension: Secondary | ICD-10-CM

## 2018-04-27 DIAGNOSIS — E782 Mixed hyperlipidemia: Secondary | ICD-10-CM

## 2018-04-27 DIAGNOSIS — E119 Type 2 diabetes mellitus without complications: Secondary | ICD-10-CM

## 2018-04-27 DIAGNOSIS — I5020 Unspecified systolic (congestive) heart failure: Secondary | ICD-10-CM

## 2018-04-27 DIAGNOSIS — I5043 Acute on chronic combined systolic (congestive) and diastolic (congestive) heart failure: Secondary | ICD-10-CM

## 2018-04-27 NOTE — Patient Instructions (Addendum)
Medication Instructions:  Your physician recommends that you continue on your current medications as directed. Please refer to the Current Medication list given to you today.  Samples of entresto given.  Labwork: Your physician recommends that you have the following labs drawn: BMP today  Testing/Procedures: Your physician has requested that you have an echocardiogram. Echocardiography is a painless test that uses sound waves to create images of your heart. It provides your doctor with information about the size and shape of your heart and how well your heart's chambers and valves are working. This procedure takes approximately one hour. There are no restrictions for this procedure.  Follow-Up: Your physician recommends that you schedule a follow-up appointment in: 3 months  Any Other Special Instructions Will Be Listed Below (If Applicable).     If you need a refill on your cardiac medications before your next appointment, please call your pharmacy.   [facility]  Garey Ham, RN, BSN  Echocardiogram An echocardiogram, or echocardiography, uses sound waves (ultrasound) to produce an image of your heart. The echocardiogram is simple, painless, obtained within a short period of time, and offers valuable information to your health care provider. The images from an echocardiogram can provide information such as:  Evidence of coronary artery disease (CAD).  Heart size.  Heart muscle function.  Heart valve function.  Aneurysm detection.  Evidence of a past heart attack.  Fluid buildup around the heart.  Heart muscle thickening.  Assess heart valve function.  Tell a health care provider about:  Any allergies you have.  All medicines you are taking, including vitamins, herbs, eye drops, creams, and over-the-counter medicines.  Any problems you or family members have had with anesthetic medicines.  Any blood disorders you have.  Any surgeries you have had.  Any  medical conditions you have.  Whether you are pregnant or may be pregnant. What happens before the procedure? No special preparation is needed. Eat and drink normally. What happens during the procedure?  In order to produce an image of your heart, gel will be applied to your chest and a wand-like tool (transducer) will be moved over your chest. The gel will help transmit the sound waves from the transducer. The sound waves will harmlessly bounce off your heart to allow the heart images to be captured in real-time motion. These images will then be recorded.  You may need an IV to receive a medicine that improves the quality of the pictures. What happens after the procedure? You may return to your normal schedule including diet, activities, and medicines, unless your health care provider tells you otherwise. This information is not intended to replace advice given to you by your health care provider. Make sure you discuss any questions you have with your health care provider. Document Released: 10/17/2000 Document Revised: 06/07/2016 Document Reviewed: 06/27/2013 Elsevier Interactive Patient Education  2017 [employer].

## 2018-04-27 NOTE — Progress Notes (Signed)
Cardiology Office Note:    Date:  04/27/2018   ID:  Jim Garcia, DOB [birth year], MRN [id number]  PCP:  System, Pcp Not In  Cardiologist:  Garwin Brothers, MD   Referring MD: No ref. provider found    ASSESSMENT:    1. Non-ischemic cardiomyopathy (HCC)   2. Essential hypertension   3. Acute on chronic combined systolic and diastolic CHF, NYHA class 3 (HCC)   4. Systolic congestive heart failure, unspecified HF chronicity (HCC)   5. Sleep apnea, obstructive   6. Type 2 diabetes mellitus without complication, without long-term current use of insulin (HCC)   7. Mixed hyperlipidemia    PLAN:    In order of problems listed above:  1. Primary prevention stressed with the patient.  Importance of compliance with diet and medications stressed and he vocalized understanding. 2. He is on excellent medications and advised heavily compliance.  He follows up good diet now.  He follows a low sodium and also weighs himself on a regular basis.  He requests samples of Entresto and these were provided to him.  He also has been enrolled in the [place] program for people with low income and he is awaiting to hear from them. 3. Primary prevention of sudden cardiac death issues were discussed.  He is not keen on LifeVest or any such evaluation at this time.  He wants to focus on taking his medications and being compliant with diet and exercise and taking medications regularly and recheck in approximately 3 months when I will see him in follow-up appointment.  He knows to go to the nearest emergency room for any concerning symptoms.  If he notices weight gain as discussed with him he will call us. 4. He has been told never to go back to smoking or any marijuana use or any such substance abuse and he promises to do so. 5. Discussed for obesity and risks of obesity explained and he plans to diet and lose weight.   Medication Adjustments/Labs and Tests Ordered: Current medicines are reviewed at length with  the patient today.  Concerns regarding medicines are outlined above.  Orders Placed This Encounter  Procedures  . Basic metabolic panel  . ECHOCARDIOGRAM COMPLETE   No orders of the defined types were placed in this encounter.    History of Present Illness:    Jim Garcia is a 52 y.o. male who is being seen today for the evaluation of ischemic cardiomyopathy and congestive heart failure.  He is referred from [facility].  Patient mentions to me that he has history of nonischemic cardiomyopathy.  I reviewed records and in 2016 he underwent coronary angiography which was essentially unremarkable with an ejection fraction of approximately 30%.  The patient mentions to me that he has not taken care of himself in the past several years.  He is to smoke marijuana.  No chest pain orthopnea or PND.  At the time of my evaluation he is alert awake oriented and in no distress.  He was admitted to the hospital with congestive heart failure treated and released.  Now he mentions to me that he follows a strict diet and takes his medications regularly and weighs himself on a regular basis.  His sister accompanies him for this visit.  At the time of my evaluation, the patient is alert awake oriented and in no distress.  Past Medical History:  Diagnosis Date  . CHF (congestive heart failure) (HCC)   . Hypertension  History reviewed. No pertinent surgical history.  Current Medications: Current Meds  Medication Sig  . carvedilol (COREG) 12.5 MG tablet Take 1 tablet (12.5 mg total) by mouth 2 (two) times daily with a meal.  . furosemide (LASIX) 80 MG tablet Take 1 tablet (80 mg total) by mouth daily.  . potassium chloride SA (K-DUR,KLOR-CON) 20 MEQ tablet Take 2 tablets (40 mEq total) by mouth daily.  . sacubitril-valsartan (ENTRESTO) 49-51 MG Take 1 tablet by mouth 2 (two) times daily.  Marland Kitchen spironolactone (ALDACTONE) 25 MG tablet Take 1 tablet (25 mg total) by mouth daily.     Allergies:    Patient has no known allergies.   Social History   Socioeconomic History  . Marital status: Single    Spouse name: Not on file  . Number of children: Not on file  . Years of education: Not on file  . Highest education level: Not on file  Occupational History  . Not on file  Social Needs  . Financial resource strain: Not on file  . Food insecurity:    Worry: Not on file    Inability: Not on file  . Transportation needs:    Medical: Not on file    Non-medical: Not on file  Tobacco Use  . Smoking status: Former Games developer  . Smokeless tobacco: Never Used  Substance and Sexual Activity  . Alcohol use: Yes    Comment: occ  . Drug use: Yes    Types: Marijuana  . Sexual activity: Not on file  Lifestyle  . Physical activity:    Days per week: Not on file    Minutes per session: Not on file  . Stress: Not on file  Relationships  . Social connections:    Talks on phone: Not on file    Gets together: Not on file    Attends religious service: Not on file    Active member of club or organization: Not on file    Attends meetings of clubs or organizations: Not on file    Relationship status: Not on file  Other Topics Concern  . Not on file  Social History Narrative  . Not on file     Family History: The patient's family history includes Diabetes in his father and mother; Hypertension in his mother.  ROS:   Please see the history of present illness.    All other systems reviewed and are negative.  EKGs/Labs/Other Studies Reviewed:    The following studies were reviewed today: I reviewed hospital records extensively.   Recent Labs: 11/05/2017: ALT 28 04/14/2018: B Natriuretic Peptide 1,540.9; Hemoglobin 11.2; Platelets 339 04/20/2018: BUN 12; Creatinine, Ser 1.08; Potassium 3.1; Sodium 139  Recent Lipid Panel No results found for: CHOL, TRIG, HDL, CHOLHDL, VLDL, LDLCALC, LDLDIRECT  Physical Exam:    VS:  BP 132/86 (BP Location: Right Arm, Patient Position: Sitting, Cuff  Size: Normal)   Pulse 89   Ht 6\' 3"  (1.905 m)   Wt 268 lb (121.6 kg)   SpO2 98%   BMI 33.50 kg/m     Wt Readings from Last 3 Encounters:  04/27/18 268 lb (121.6 kg)  04/20/18 270 lb (122.5 kg)  11/05/17 (!) 308 lb 3.3 oz (139.8 kg)     GEN: Patient is in no acute distress HEENT: Normal NECK: No JVD; No carotid bruits LYMPHATICS: No lymphadenopathy CARDIAC: S1 S2 regular, 2/6 systolic murmur at the apex. RESPIRATORY:  Clear to auscultation without rales, wheezing or rhonchi  ABDOMEN: Soft, non-tender, non-distended  MUSCULOSKELETAL:  No edema; No deformity  SKIN: Warm and dry NEUROLOGIC:  Alert and oriented x 3 PSYCHIATRIC:  Normal affect    Signed, Garwin Brothers, MD  04/27/2018 9:37 AM    [facility]

## 2018-04-28 LAB — BASIC METABOLIC PANEL
BUN/Creatinine Ratio: 12 (ref 9–20)
BUN: 14 mg/dL (ref 6–24)
CALCIUM: 9.7 mg/dL (ref 8.7–10.2)
CHLORIDE: 102 mmol/L (ref 96–106)
CO2: 23 mmol/L (ref 20–29)
Creatinine, Ser: 1.15 mg/dL (ref 0.76–1.27)
GFR, EST AFRICAN AMERICAN: 86 mL/min/{1.73_m2} (ref >59)
GFR, EST NON AFRICAN AMERICAN: 74 mL/min/{1.73_m2} (ref >59)
Glucose: 121 mg/dL — ABNORMAL HIGH (ref 65–99)
Potassium: 4.7 mmol/L (ref 3.5–5.2)
Sodium: 140 mmol/L (ref 134–144)

## 2018-04-29 ENCOUNTER — Telehealth: Payer: Self-pay

## 2018-04-29 NOTE — Telephone Encounter (Signed)
Called patient and notified them of there Lab results.

## 2018-05-11 ENCOUNTER — Inpatient Hospital Stay: Payer: Self-pay

## 2018-05-13 ENCOUNTER — Other Ambulatory Visit: Payer: Self-pay | Admitting: *Deleted

## 2018-05-14 ENCOUNTER — Ambulatory Visit: Payer: Self-pay | Attending: Family Medicine | Admitting: Family Medicine

## 2018-05-14 ENCOUNTER — Ambulatory Visit (HOSPITAL_BASED_OUTPATIENT_CLINIC_OR_DEPARTMENT_OTHER)
Admission: RE | Admit: 2018-05-14 | Discharge: 2018-05-14 | Disposition: A | Discharge status: Home / Self Care | Payer: Self-pay | Source: Ambulatory Visit | Attending: Cardiology | Admitting: Cardiology

## 2018-05-14 ENCOUNTER — Encounter: Payer: Self-pay | Admitting: Family Medicine

## 2018-05-14 ENCOUNTER — Other Ambulatory Visit: Payer: Self-pay

## 2018-05-14 VITALS — BP 126/88 | HR 74 | Temp 99.0°F | Resp 16 | Wt 265.0 lb

## 2018-05-14 DIAGNOSIS — I110 Hypertensive heart disease with heart failure: Secondary | ICD-10-CM | POA: Insufficient documentation

## 2018-05-14 DIAGNOSIS — E785 Hyperlipidemia, unspecified: Secondary | ICD-10-CM | POA: Insufficient documentation

## 2018-05-14 DIAGNOSIS — I509 Heart failure, unspecified: Secondary | ICD-10-CM | POA: Insufficient documentation

## 2018-05-14 DIAGNOSIS — I428 Other cardiomyopathies: Secondary | ICD-10-CM

## 2018-05-14 DIAGNOSIS — E119 Type 2 diabetes mellitus without complications: Secondary | ICD-10-CM | POA: Insufficient documentation

## 2018-05-14 DIAGNOSIS — E782 Mixed hyperlipidemia: Secondary | ICD-10-CM | POA: Insufficient documentation

## 2018-05-14 DIAGNOSIS — I340 Nonrheumatic mitral (valve) insufficiency: Secondary | ICD-10-CM | POA: Insufficient documentation

## 2018-05-14 DIAGNOSIS — I429 Cardiomyopathy, unspecified: Secondary | ICD-10-CM | POA: Insufficient documentation

## 2018-05-14 DIAGNOSIS — Z131 Encounter for screening for diabetes mellitus: Secondary | ICD-10-CM

## 2018-05-14 DIAGNOSIS — I10 Essential (primary) hypertension: Secondary | ICD-10-CM

## 2018-05-14 DIAGNOSIS — Z79899 Other long term (current) drug therapy: Secondary | ICD-10-CM | POA: Insufficient documentation

## 2018-05-14 LAB — ECHOCARDIOGRAM COMPLETE: Weight: 4240 oz

## 2018-05-14 MED ORDER — SACUBITRIL-VALSARTAN 49-51 MG PO TABS: 1.0000 | ORAL_TABLET | Freq: Two times a day (BID) | ORAL | 3 refills | Status: DC

## 2018-05-14 MED FILL — ENTRESTO 49 MG-51 MG TABLET: 49-51 | 30 days supply | Qty: 60 | Fill #0

## 2018-05-14 NOTE — Patient Instructions (Signed)
Heart Failure °Heart failure means your heart has trouble pumping blood. This makes it hard for your body to work well. Heart failure is usually a long-term (chronic) condition. You must take good care of yourself and follow your doctor's treatment plan. °Follow these instructions at home: °· Take your heart medicine as told by your doctor. °? Do not stop taking medicine unless your doctor tells you to. °? Do not skip any dose of medicine. °? Refill your medicines before they run out. °? Take other medicines only as told by your doctor or pharmacist. °· Stay active if told by your doctor. The elderly and people with severe heart failure should talk with a doctor about physical activity. °· Eat heart-healthy foods. Choose foods that are without trans fat and are low in saturated fat, cholesterol, and salt (sodium). This includes fresh or frozen fruits and vegetables, fish, lean meats, fat-free or low-fat dairy foods, whole grains, and high-fiber foods. Lentils and dried peas and beans (legumes) are also good choices. °· Limit salt if told by your doctor. °· Cook in a healthy way. Roast, grill, broil, bake, poach, steam, or stir-fry foods. °· Limit fluids as told by your doctor. °· Weigh yourself every morning. Do this after you pee (urinate) and before you eat breakfast. Write down your weight to give to your doctor. °· Take your blood pressure and write it down if your doctor tells you to. °· Ask your doctor how to check your pulse. Check your pulse as told. °· Lose weight if told by your doctor. °· Stop smoking or chewing tobacco. Do not use gum or patches that help you quit without your doctor's approval. °· Schedule and go to doctor visits as told. °· Nonpregnant women should have no more than 1 drink a day. Men should have no more than 2 drinks a day. Talk to your doctor about drinking alcohol. °· Stop illegal drug use. °· Stay current with shots (immunizations). °· Manage your health conditions as told by your  doctor. °· Learn to manage your stress. °· Rest when you are tired. °· If it is really hot outside: °? Avoid intense activities. °? Use air conditioning or fans, or get in a cooler place. °? Avoid caffeine and alcohol. °? Wear loose-fitting, lightweight, and light-colored clothing. °· If it is really cold outside: °? Avoid intense activities. °? Layer your clothing. °? Wear mittens or gloves, a hat, and a scarf when going outside. °? Avoid alcohol. °· Learn about heart failure and get support as needed. °· Get help to maintain or improve your quality of life and your ability to care for yourself as needed. °Contact a doctor if: °· You gain weight quickly. °· You are more short of breath than usual. °· You cannot do your normal activities. °· You tire easily. °· You cough more than normal, especially with activity. °· You have any or more puffiness (swelling) in areas such as your hands, feet, ankles, or belly (abdomen). °· You cannot sleep because it is hard to breathe. °· You feel like your heart is beating fast (palpitations). °· You get dizzy or light-headed when you stand up. °Get help right away if: °· You have trouble breathing. °· There is a change in mental status, such as becoming less alert or not being able to focus. °· You have chest pain or discomfort. °· You faint. °This information is not intended to replace advice given to you by your health care provider. Make sure you   discuss any questions you have with your health care provider. °Document Released: 07/29/2008 Document Revised: 03/27/2016 Document Reviewed: 12/06/2012 °Elsevier Interactive Patient Education © 2017 [employer] Inc[employer] ° °

## 2018-05-14 NOTE — Progress Notes (Signed)
  Echocardiogram 2D Echocardiogram has been performed.  Adrion Menz T Alyiah Ulloa 05/14/2018, 12:17 PM

## 2018-05-14 NOTE — Progress Notes (Signed)
Hospital f/u.

## 2018-05-14 NOTE — Progress Notes (Signed)
Subjective:  Patient ID: Jim Garcia, male    DOB: 11/06/1965  Age: 52 y.o. MRN: [id number]  CC: Hospitalization Follow-up   HPI Jim Garcia is a 52 year old male with a history of hypertension, nonischemic cardiomyopathy (EF 20 to 25% from echo of 04/2018 who presents today to establish care with me. He had a hospitalization for acute on chronic CHF at [facility] in 04/2018 after he had been out of his medications due to financial difficulties.  He had presented with worsening dyspnea and  pedal edema, BNP was 1541, troponins were slightly elevated thought to be secondary to CHF.  He was placed on IV diuretics with subsequent diuresis, his home medications were also resumed. Echocardiogram revealed: Study Conclusions  - Left ventricle: The cavity size was severely dilated. Systolic   function was severely reduced. The estimated ejection fraction   was in the range of 20% to 25%. Diffuse hypokinesis. Features are   consistent with a pseudonormal left ventricular filling pattern,   with concomitant abnormal relaxation and increased filling   pressure (grade 2 diastolic dysfunction). - Mitral valve: There was mild regurgitation. - Left atrium: The atrium was severely dilated. - Right atrium: The atrium was moderately to severely dilated. - Tricuspid valve: There was mild-moderate regurgitation. - Pulmonary arteries: Systolic pressure was moderately increased.   PA peak pressure: 45 mm Hg (S).  Electrolyte derangements were corrected and he was transitioned to oral Lasix and his home medications titrated. He had a Cardiology follow-up with [facility] heart care on 04/27/2018 and as per notes patient not keen on LifeVest. Today he informs me he has been compliant with his medications and his weight has been stable recall he has lost 3 pounds in the last 2 weeks.  He does have slight pedal edema. He denies chest pains, shortness of breath.  He is requesting samples of Entresto and has  been directed to the pharmacy where he will also receive assistance for application for the patient assistance program.  Past Medical History:  Diagnosis Date  . CHF (congestive heart failure) ([place])   . Hypertension     No past surgical history on file.  No Known Allergies   Outpatient Medications Prior to Visit  Medication Sig Dispense Refill  . carvedilol (COREG) 12.5 MG tablet Take 1 tablet (12.5 mg total) by mouth 2 (two) times daily with a meal. 180 tablet 3  . furosemide (LASIX) 80 MG tablet Take 1 tablet (80 mg total) by mouth daily. 90 tablet 3  . potassium chloride SA (K-DUR,KLOR-CON) 20 MEQ tablet Take 2 tablets (40 mEq total) by mouth daily. 90 tablet 3  . spironolactone (ALDACTONE) 25 MG tablet Take 1 tablet (25 mg total) by mouth daily. 90 tablet 3  . sacubitril-valsartan (ENTRESTO) 49-51 MG Take 1 tablet by mouth 2 (two) times daily. (Patient not taking: Reported on 05/14/2018) 60 tablet 6   No facility-administered medications prior to visit.     ROS Review of Systems  Constitutional: Negative for activity change and appetite change.  HENT: Negative for sinus pressure and sore throat.   Eyes: Negative for visual disturbance.  Respiratory: Negative for cough, chest tightness and shortness of breath.   Cardiovascular: Positive for leg swelling. Negative for chest pain.  Gastrointestinal: Negative for abdominal distention, abdominal pain, constipation and diarrhea.  Endocrine: Negative.   Genitourinary: Negative for dysuria.  Musculoskeletal: Negative for joint swelling and myalgias.  Skin: Negative for rash.  Allergic/Immunologic: Negative.   Neurological: Negative for weakness,  light-headedness and numbness.  Psychiatric/Behavioral: Negative for dysphoric mood and suicidal ideas.    Objective:  BP 126/88 (BP Location: Right Arm, Patient Position: Sitting, Cuff Size: Large)   Pulse 74   Temp 99 F (37.2 C) (Oral)   Resp 16   Wt 265 lb (120.2 kg)   SpO2 99%    BMI 33.12 kg/m   BP/Weight 05/14/2018 04/27/2018 2/30/1720  Systolic BP 910 681 661  Diastolic BP 88 86 92  Wt. (Lbs) 265 268 270  BMI 33.12 33.5 33.75    Wt Readings from Last 3 Encounters:  05/14/18 265 lb (120.2 kg)  04/27/18 268 lb (121.6 kg)  04/20/18 270 lb (122.5 kg)     Physical Exam  Constitutional: He is oriented to person, place, and time. He appears well-developed and well-nourished.  Neck: No JVD present.  Cardiovascular: Normal rate, normal heart sounds and intact distal pulses.  No murmur heard. Pulmonary/Chest: Effort normal and breath sounds normal. He has no wheezes. He has no rales. He exhibits no tenderness.  Abdominal: Soft. Bowel sounds are normal. He exhibits no distension and no mass. There is no tenderness.  Musculoskeletal: Normal range of motion. He exhibits edema (1+non pitting b/l pedal edema).  Neurological: He is alert and oriented to person, place, and time.  Skin: Skin is warm and dry.     Assessment & Plan:   1. Non-ischemic cardiomyopathy (HCC) EF 20 to 25% Euvolemic Continue Entresto, Lasix We will check renal function - sacubitril-valsartan (ENTRESTO) 49-51 MG; Take 1 tablet by mouth 2 (two) times daily.  Dispense: 180 tablet; Refill: 3  2. Mixed hyperlipidemia Continue statin Low-cholesterol diet  3. Essential hypertension Blood pressure is normal at 126/88-without his taking his antihypertensives today I am concerned he could become hypotensive on all his medications and have advised keeping a blood pressure log at home. Follow-up with the clinical pharmacist in 2 weeks for BP check and if blood pressure is in the hypotensive range, reduce Coreg to 6.25 twice daily. - CMP14+EGFR  4. Screening for diabetes mellitus - Hemoglobin A1c   Meds ordered this encounter  Medications  . sacubitril-valsartan (ENTRESTO) 49-51 MG    Sig: Take 1 tablet by mouth 2 (two) times daily.    Dispense:  180 tablet    Refill:  3     Follow-up: Return in about 2 weeks (around 05/28/2018) for Blood pressure check with Lurena Joiner; 3 months follow-up of chronic conditions with PCP.   Charlott Rakes MD

## 2018-05-15 LAB — CMP14+EGFR
ALBUMIN: 4.2 g/dL (ref 3.5–5.5)
ALT: 13 IU/L (ref 0–44)
AST: 12 IU/L (ref 0–40)
Albumin/Globulin Ratio: 1.5 (ref 1.2–2.2)
Alkaline Phosphatase: 84 IU/L (ref 39–117)
BUN / CREAT RATIO: 15 (ref 9–20)
BUN: 18 mg/dL (ref 6–24)
Bilirubin Total: 0.8 mg/dL (ref 0.0–1.2)
CALCIUM: 9.8 mg/dL (ref 8.7–10.2)
CO2: 21 mmol/L (ref 20–29)
CREATININE: 1.2 mg/dL (ref 0.76–1.27)
Chloride: 106 mmol/L (ref 96–106)
GFR calc Af Amer: 82 mL/min/{1.73_m2} (ref >59)
GFR, EST NON AFRICAN AMERICAN: 71 mL/min/{1.73_m2} (ref >59)
GLOBULIN, TOTAL: 2.8 g/dL (ref 1.5–4.5)
Glucose: 96 mg/dL (ref 65–99)
Potassium: 4.3 mmol/L (ref 3.5–5.2)
SODIUM: 144 mmol/L (ref 134–144)
Total Protein: 7 g/dL (ref 6.0–8.5)

## 2018-05-15 LAB — HEMOGLOBIN A1C
ESTIMATED AVERAGE GLUCOSE: 163 mg/dL
Hgb A1c MFr Bld: 7.3 % — ABNORMAL HIGH (ref 4.8–5.6)

## 2018-05-17 ENCOUNTER — Other Ambulatory Visit: Payer: Self-pay | Admitting: Family Medicine

## 2018-05-17 MED ORDER — METFORMIN HCL 500 MG PO TABS: 500.0000 mg | ORAL_TABLET | Freq: Two times a day (BID) | ORAL | 3 refills | Status: DC

## 2018-05-18 ENCOUNTER — Other Ambulatory Visit: Payer: Self-pay | Admitting: *Deleted

## 2018-05-18 MED ORDER — GLUCOSE BLOOD VI STRP: ORAL_STRIP | 12 refills | Status: DC

## 2018-05-18 MED ORDER — TRUEPLUS SAFETY LANCETS 28G MISC: 1.0000 | Freq: Three times a day (TID) | 3 refills | Status: AC

## 2018-05-18 MED ORDER — TRUE METRIX METER W/DEVICE KIT: 1.0000 | PACK | Freq: Three times a day (TID) | 0 refills | Status: DC

## 2018-05-24 ENCOUNTER — Other Ambulatory Visit: Payer: Self-pay | Admitting: *Deleted

## 2018-05-24 DIAGNOSIS — I5020 Unspecified systolic (congestive) heart failure: Secondary | ICD-10-CM

## 2018-05-24 DIAGNOSIS — I428 Other cardiomyopathies: Secondary | ICD-10-CM

## 2018-05-27 ENCOUNTER — Ambulatory Visit: Payer: Self-pay | Attending: Family Medicine | Admitting: Pharmacist

## 2018-05-27 ENCOUNTER — Encounter: Payer: Self-pay | Admitting: Pharmacist

## 2018-05-27 VITALS — BP 120/79 | HR 73

## 2018-05-27 DIAGNOSIS — Z131 Encounter for screening for diabetes mellitus: Secondary | ICD-10-CM

## 2018-05-27 DIAGNOSIS — I5040 Unspecified combined systolic (congestive) and diastolic (congestive) heart failure: Secondary | ICD-10-CM | POA: Insufficient documentation

## 2018-05-27 DIAGNOSIS — Z7984 Long term (current) use of oral hypoglycemic drugs: Secondary | ICD-10-CM | POA: Insufficient documentation

## 2018-05-27 DIAGNOSIS — E119 Type 2 diabetes mellitus without complications: Secondary | ICD-10-CM | POA: Insufficient documentation

## 2018-05-27 DIAGNOSIS — Z79899 Other long term (current) drug therapy: Secondary | ICD-10-CM | POA: Insufficient documentation

## 2018-05-27 DIAGNOSIS — I110 Hypertensive heart disease with heart failure: Secondary | ICD-10-CM | POA: Insufficient documentation

## 2018-05-27 DIAGNOSIS — I428 Other cardiomyopathies: Secondary | ICD-10-CM | POA: Insufficient documentation

## 2018-05-27 LAB — GLUCOSE, POCT (MANUAL RESULT ENTRY): POC Glucose: 104 mg/dl — AB (ref 70–99)

## 2018-05-27 MED FILL — TRUE METRIX TEST STRIP: 30 days supply | Qty: 100 | Fill #0

## 2018-05-27 MED FILL — TRUEplus LANCETS 28G MISC: 30 days supply | Qty: 100 | Fill #0

## 2018-05-27 MED FILL — !TRUE METRIX BLOOD GLUCOSE: 365 days supply | Qty: 1 | Fill #0

## 2018-05-27 NOTE — Progress Notes (Signed)
S:    PCP: Dr. Alvis Lemmings   Mr. Jim Garcia is a 52 YO male with a PMH of HTN, nonischemic cardiomyopathy (EF 20-25% from 04/2018 echo), combined systolic/diastolic HF, T2DM, hyperlipidemia, and morbid obesity who presents today for hypertension and DM management. Patient was referred by Dr. Alvis Lemmings on 05/14/18.  He was last seen by Primary Care Provider on on that day.   HPI:  Office visit 05/14/18 with PCP - Patient's BP was 126/88 without taking medications that day. Dr. Alvis Lemmings expressed concern over possible hypotension risk. Was advised to track home pressures. Of note, A1c was 7.3 at this visit.   1. HTN:  Patient denies chest pain, headache, dizziness, and palpitations. He denies shortness of breath or BLE-edema.  Patient reports adherence with medications.  Current BP Medications include:   -Carvedilol 12.5 mg BID -Furosemide 80 mg  -Entresto 49-51 mg BID -Spirinolactone 25 mg daily  Patient is not checking pressure at home, states that his home cuff is inaccurate.  Dietary habits include: limits salt, Ms. Dash, limits fluids < 1500 mL.  Exercise habits include: works in [employer]; walks around during work - reports starting to walk with neighbor in AM  2. DM: Patient states he received a call from a nurse from the clinic stating that his A1c was high. Additionally, he was told that metformin was ready for pick-up.   Patient denies adherence with medications.  Current DM Medications include: Metformin 500 mg BID with meals  Dietary habits include: reports avoiding white carbs, occasional sweet beverage but dilutes with water Exercise habits include:works in warehouse; walks around during work - reports starting to walk with neighbor in AM  Family / Social history: -FH: mother passed away from heart attack at age 50, brother pre-diabetic, father diabetes -Tobacco: does not currently smoke cigarettes -Alcohol: occasionally -Illicit: occasional marijuana use   O:  Office BP  120/79 POC glucose 104. Patient has not eaten today  Last 3 Office BP readings: BP Readings from Last 3 Encounters:  05/14/18 126/88  04/27/18 132/86  04/20/18 (!) 129/92    BMET    Component Value Date/Time   NA 144 05/14/2018 1039   K 4.3 05/14/2018 1039   CL 106 05/14/2018 1039   CO2 21 05/14/2018 1039   GLUCOSE 96 05/14/2018 1039   GLUCOSE 110 (H) 04/20/2018 0339   BUN 18 05/14/2018 1039   CREATININE 1.20 05/14/2018 1039   CALCIUM 9.8 05/14/2018 1039   GFRNONAA 71 05/14/2018 1039   GFRAA 82 05/14/2018 1039    Lab Results  Component Value Date   HGBA1C 7.3 (H) 05/14/2018    Renal function: Estimated Creatinine Clearance: 104.1 mL/min (by C-G formula based on SCr of 1.2 mg/dL).  A/P: 1. HTN: Hypertension longstanding currently controlled on medications. BP Goal  <130/80 mmHg. Patient is adherent with current medications.  -Continued current antihypertensives.  -F/u labs ordered - none, BMET resulted 05/14/18 - Renal function and electrolytes WNL -Counseled on lifestyle modifications for blood pressure control including reduced dietary sodium, increased exercise, adequate sleep   2. DM: Diabetes newly diagnosed currently uncontrolled due to A1c above 7 (7.3). Patient is not adherent with metformin. Control is suboptimal due to dietary indiscretion. Patient voiced frustration over being contacted via phone to pick up glucose testing supplies and metformin. He demonstrated a strong aversion to initiating metformin. He has not taken metformin and his fasting glucose today is at goal. He would still benefit from metformin given his elevated A1c. However, he is  wishing to control with diet alone. We advised him to track home sugars (fasting and post-prandial). We will evaluate these in a month and reassess for metformin use.  -Prescription sent in for testing supplies. Patient picked these up and we covered how to use. Patient demonstrated understanding using  teach-back. -Extensively discussed pathophysiology of DM, recommended lifestyle interventions, dietary effects on glycemic control -Resources provided include: MyPlate, carb tracking, and reading nutrition labels -Next A1C anticipated 08/2018.   Written patient instructions provided.  Total time in face to face counseling 30 minutes.   Follow up Pharmacist Clinic Visit in about a month.   Mirna Mires, PharmD Candidate [employer] Class of 2020  Butch Penny, PharmD, CPP Clinical Pharmacist [facility] [phone]

## 2018-05-27 NOTE — Patient Instructions (Addendum)
Thank you for coming to see [place] today. Please do the following:  1. Start checking blood sugars at home. It's really important that you record these and bring these in to your next doctor's appointment. If you get in readings above 500 or lower than 70, call me or the clinic to let your doctor know. See below on how to treat low blood sugar.  2. Continue making the lifestyle changes we've discussed together during our visit. Diet and exercise play a significant role in improving your blood sugars.  3. Follow-up with me around 06/30/18.  For your blood pressure, keep taking your medications as directed. Try to check pressures at home. If you see anything on top in the 90-100 range (or below) call us. If anything on the bottom is under 60, call us.   Hypoglycemia or low blood sugar:   Low blood sugar can happen quickly and may become an emergency if not treated right away.   While this shouldn't happen often, it can be brought upon if you skip a meal or do not eat enough. Also, if your insulin or other diabetes medications are dosed too high, this can cause your blood sugar to go to low.   Warning signs of low blood sugar include: 1. Feeling shaky or dizzy 2. Feeling weak or tired  3. Excessive hunger 4. Feeling anxious or upset  5. Sweating even when you aren't exercising  What to do if I experience low blood sugar? 1. Check your blood sugar with your meter. If lower than 70, proceed to step 2.  2. Treat with 3-4 glucose tablets or 3 packets of regular sugar. If these aren't around, you can try hard candy. Yet another option would be to drink 4 ounces of fruit juice or 6 ounces of REGULAR soda.  3. Re-check your sugar in 15 minutes. If it is still below 70, do what you did in step 2 again. If has come back up, go ahead and eat a snack or small meal at this time.

## 2018-06-23 ENCOUNTER — Telehealth: Payer: Self-pay | Admitting: Cardiology

## 2018-06-23 NOTE — Telephone Encounter (Signed)
Called and spoke with patient, he is currently waiting on his patient assistance to come through. I let him know I have samples at the front desk for him to pick up.

## 2018-06-23 NOTE — Telephone Encounter (Signed)
Patient is asking for Entresto samples, 49-51mg 

## 2018-07-01 ENCOUNTER — Ambulatory Visit: Payer: Self-pay | Attending: Family Medicine | Admitting: Pharmacist

## 2018-07-01 VITALS — BP 132/86 | HR 72

## 2018-07-01 DIAGNOSIS — I509 Heart failure, unspecified: Secondary | ICD-10-CM | POA: Insufficient documentation

## 2018-07-01 DIAGNOSIS — Z7689 Persons encountering health services in other specified circumstances: Secondary | ICD-10-CM | POA: Insufficient documentation

## 2018-07-01 DIAGNOSIS — E119 Type 2 diabetes mellitus without complications: Secondary | ICD-10-CM | POA: Insufficient documentation

## 2018-07-01 DIAGNOSIS — I10 Essential (primary) hypertension: Secondary | ICD-10-CM

## 2018-07-01 DIAGNOSIS — Z79899 Other long term (current) drug therapy: Secondary | ICD-10-CM | POA: Insufficient documentation

## 2018-07-01 DIAGNOSIS — I110 Hypertensive heart disease with heart failure: Secondary | ICD-10-CM | POA: Insufficient documentation

## 2018-07-01 NOTE — Progress Notes (Signed)
   S:     PCP: Dr. Alvis Lemmings  Patient arrives in good spirits. Presents to the clinic for hypertension evaluation, counseling, and management. PMH significant for HTN, DM, and CHF NYHA Class 3. Patient was last seen and referred by Primary Care Provider on 05/14/2018. At this visit, patient noted to be at BP goal without taking antihypertensives that day. Referred to me for concern of hypotension with taking medication.  Patient reports adherence with medications. Patient notes missing one dose on Sunday.   Current BP Medications include:   - sacubitril-valsartan 49-51: 1 tablet BID - spironolactone 25 mg: 1 tablet daily - carvedilol 12.5 mg: 2 tablets daily w/ meals - furosemide 80 mg: 1 tablet daily  Antihypertensives tried in the past include: - amlodipine 10 mg - carvedilol 3.125mg , 6.25mg , 25 mg - furosemide 40 mg - losartan 100 mg   Dietary habits include: fish, chicken, [place], salads Exercise habits include: walking 15-20 min/day and at work Family / Social history: DM, HTN (Mother), DM (Father). Former smoker. Denies alcohol use.  ASCVD risk factors include: HTN, HLD  Home BP readings: Does not monitor at home.  O:   BP reading after 5 min rest in R Arm 132/86 HR 72.  Last 3 Office BP readings: BP Readings from Last 3 Encounters:  05/27/18 120/79  05/14/18 126/88  04/27/18 132/86    BMET    Component Value Date/Time   NA 144 05/14/2018 1039   K 4.3 05/14/2018 1039   CL 106 05/14/2018 1039   CO2 21 05/14/2018 1039   GLUCOSE 96 05/14/2018 1039   GLUCOSE 110 (H) 04/20/2018 0339   BUN 18 05/14/2018 1039   CREATININE 1.20 05/14/2018 1039   CALCIUM 9.8 05/14/2018 1039   GFRNONAA 71 05/14/2018 1039   GFRAA 82 05/14/2018 1039    Renal function: CrCl cannot be calculated (Patient's most recent lab result is older than the maximum 21 days allowed.).  A/P: Hypertension longstanding currently controlled on current medications. BP Goal < 13/80 mmHg. Patient is  adherent with current medications.  -Continued antihypertensive medications.  -Counseled on lifestyle modifications for blood pressure control including reduced dietary sodium, increased exercise, adequate sleep  Results reviewed and written information provided.   Total time in face-to-face counseling 30 minutes.   Follow-up with PCP.  Patient seen with:  Mosie Lukes, PharmD Candidate [employer] of 2021   Butch Penny, PharmD, CPP Clinical Pharmacist [facility] [phone]

## 2018-07-01 NOTE — Patient Instructions (Signed)

## 2018-07-07 ENCOUNTER — Ambulatory Visit (INDEPENDENT_AMBULATORY_CARE_PROVIDER_SITE_OTHER): Payer: Self-pay | Admitting: Cardiology

## 2018-07-07 ENCOUNTER — Encounter: Payer: Self-pay | Admitting: Cardiology

## 2018-07-07 VITALS — BP 130/70 | HR 63 | Ht 75.0 in | Wt 256.0 lb

## 2018-07-07 DIAGNOSIS — I428 Other cardiomyopathies: Secondary | ICD-10-CM

## 2018-07-07 DIAGNOSIS — I10 Essential (primary) hypertension: Secondary | ICD-10-CM

## 2018-07-07 DIAGNOSIS — G4733 Obstructive sleep apnea (adult) (pediatric): Secondary | ICD-10-CM

## 2018-07-07 NOTE — Progress Notes (Signed)
 Electrophysiology Office Note   Date:  07/07/2018   ID:  Jim Garcia, DOB 01/13/1966, MRN [id number]  PCP:  Newlin, Enobong, MD  Cardiologist:  Revankar Primary Electrophysiologist:   Martin , MD    No chief complaint on file.    History of Present Illness: Jim Garcia is a 52 y.o. male who is being seen today for the evaluation of CHF at the request of Rajan Revankar. Presenting today for electrophysiology evaluation.  He has a history of nonischemic cardiomyopathy and congestive heart failure.  In 2016, he underwent coronary angiography which was essentially unremarkable with an ejection fraction of 30%.  He was admitted to the hospital June 2019 with congestive heart failure.  He has been following a strict diet since that time and has been compliant with his medications.  He has been weighing himself daily.    Today, he denies symptoms of palpitations, chest pain, shortness of breath, orthopnea, PND, lower extremity edema, claudication, dizziness, presyncope, syncope, bleeding, or neurologic sequela. The patient is tolerating medications without difficulties.  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  He does not have much in the way of shortness of breath or fatigue.  He feels much better since being on his heart failure medications after his hospitalization.  He was noncompliant prior to that.   Past Medical History:  Diagnosis Date  . CHF (congestive heart failure) (HCC)   . Hypertension    History reviewed. No pertinent surgical history.   Current Outpatient Medications  Medication Sig Dispense Refill  . Blood Glucose Monitoring Suppl (TRUE METRIX METER) w/Device KIT 1 Device by Does not apply route 3 (three) times daily. 1 kit 0  . carvedilol (COREG) 12.5 MG tablet Take 1 tablet (12.5 mg total) by mouth 2 (two) times daily with a meal. 180 tablet 3  . furosemide (LASIX) 80 MG tablet Take 1 tablet (80 mg total) by mouth daily. 90 tablet 3  .  glucose blood (TRUE METRIX BLOOD GLUCOSE TEST) test strip Use as instructed 100 each 12  . metFORMIN (GLUCOPHAGE) 500 MG tablet Take 1 tablet (500 mg total) by mouth 2 (two) times daily with a meal. 60 tablet 3  . potassium chloride SA (K-DUR,KLOR-CON) 20 MEQ tablet Take 2 tablets (40 mEq total) by mouth daily. 90 tablet 3  . sacubitril-valsartan (ENTRESTO) 49-51 MG Take 1 tablet by mouth 2 (two) times daily. 180 tablet 3  . spironolactone (ALDACTONE) 25 MG tablet Take 1 tablet (25 mg total) by mouth daily. 90 tablet 3  . TRUEPLUS SAFETY LANCETS 28G MISC 1 Stick by Does not apply route 3 (three) times daily. 100 each 3   No current facility-administered medications for this visit.     Allergies:   Patient has no known allergies.   Social History:  The patient  reports that he has quit smoking. He has never used smokeless tobacco. He reports that he drinks alcohol. He reports that he has current or past drug history. Drug: Marijuana.   Family History:  The patient's family history includes Diabetes in his father and mother; Hypertension in his mother.    ROS:  Please see the history of present illness.   Otherwise, review of systems is positive for none.   All other systems are reviewed and negative.    PHYSICAL EXAM: VS:  BP 130/70   Pulse 63   Ht 6' 3" (1.905 m)   Wt 256 lb (116.1 kg)   BMI 32.00 kg/m  ,   BMI Body mass index is 32 kg/m. GEN: Well nourished, well developed, in no acute distress  HEENT: normal  Neck: no JVD, carotid bruits, or masses Cardiac: RRR; no murmurs, rubs, or gallops,no edema  Respiratory:  clear to auscultation bilaterally, normal work of breathing GI: soft, nontender, nondistended, + BS MS: no deformity or atrophy  Skin: warm and dry Neuro:  Strength and sensation are intact Psych: euthymic mood, full affect  EKG:  EKG is ordered today. Personal review of the ekg ordered shows this rhythm, nonspecific interventricular conduction block, QRS 130 ms,  lateral T wave inversions, rate 63  Recent Labs: 04/14/2018: B Natriuretic Peptide 1,540.9; Hemoglobin 11.2; Platelets 339 05/14/2018: ALT 13; BUN 18; Creatinine, Ser 1.20; Potassium 4.3; Sodium 144    Lipid Panel  No results found for: CHOL, TRIG, HDL, CHOLHDL, VLDL, LDLCALC, LDLDIRECT   Wt Readings from Last 3 Encounters:  07/07/18 256 lb (116.1 kg)  05/14/18 265 lb (120.2 kg)  04/27/18 268 lb (121.6 kg)      Other studies Reviewed: Additional studies/ records that were reviewed today include: TTE 05/14/18  Review of the above records today demonstrates:  - Left ventricle: The cavity size was moderately dilated. Wall   thickness was increased in a pattern of mild LVH. Systolic   function was severely reduced. The estimated ejection fraction   was in the range of 25% to 30%. Wall motion was normal; there   were no regional wall motion abnormalities. Doppler parameters   are consistent with abnormal left ventricular relaxation (grade 1   diastolic dysfunction). - Mitral valve: There was mild regurgitation. - Left atrium: The atrium was moderately dilated. - Pulmonary arteries: PA peak pressure: 31 mm Hg (S).   ASSESSMENT AND PLAN:  1.  Nonischemic cardiomyopathy: Currently on optimal medical therapy with Entresto, carvedilol, and Aldactone.  He had previously been noncompliant and only been compliant since his hospitalization in June.  He would like to wait to see if his ejection fraction  improve further.  I  get an echo in 2 months and we  see him back after that for further discussions of ICD therapy.  2.  Hypertension: Currently well controlled.  3.  Obstructive sleep apnea: Encouraged CPAP compliance     Current medicines are reviewed at length with the patient today.   The patient does not have concerns regarding his medicines.  The following changes were made today:  none  Labs/ tests ordered today include:  Orders Placed This Encounter  Procedures  .  EKG 12-Lead  . ECHOCARDIOGRAM COMPLETE   Case discussed with referring  Disposition:   FU with   2 months  Signed,  Martin , MD  07/07/2018 3:39 PM     [facility] [address] [place] [address] [phone] (office) [phone] (fax)  

## 2018-07-07 NOTE — Patient Instructions (Signed)
Medication Instructions:  Your physician recommends that you continue on your current medications as directed. Please refer to the Current Medication list given to you today.  * If you need a refill on your cardiac medications before your next appointment, please call your pharmacy.   Labwork: None ordered *We will only notify you of abnormal results, otherwise continue current treatment plan.  Testing/Procedures: Your physician has requested that you have an echocardiogram in 2 months - before your follow up appointment with Dr. Elberta Fortis.  Echocardiography is a painless test that uses sound waves to create images of your heart. It provides your doctor with information about the size and shape of your heart and how well your heart's chambers and valves are working. This procedure takes approximately one hour. There are no restrictions for this procedure.  Follow-Up: Your physician recommends that you schedule a follow-up appointment in: 2 months with Dr. Elberta Fortis in [place] -- you will need to have completed echocardiogram prior to this follow up appointment with physician.   Thank you for choosing [facility]!!   Dory Horn, RN [phone]  Any Other Special Instructions Will Be Listed Below (If Applicable).   Echocardiogram An echocardiogram, or echocardiography, uses sound waves (ultrasound) to produce an image of your heart. The echocardiogram is simple, painless, obtained within a short period of time, and offers valuable information to your health care provider. The images from an echocardiogram can provide information such as:  Evidence of coronary artery disease (CAD).  Heart size.  Heart muscle function.  Heart valve function.  Aneurysm detection.  Evidence of a past heart attack.  Fluid buildup around the heart.  Heart muscle thickening.  Assess heart valve function.  Tell a health care provider about:  Any allergies you have.  All medicines you  are taking, including vitamins, herbs, eye drops, creams, and over-the-counter medicines.  Any problems you or family members have had with anesthetic medicines.  Any blood disorders you have.  Any surgeries you have had.  Any medical conditions you have.  Whether you are pregnant or may be pregnant. What happens before the procedure? No special preparation is needed. Eat and drink normally. What happens during the procedure?  In order to produce an image of your heart, gel will be applied to your chest and a wand-like tool (transducer) will be moved over your chest. The gel will help transmit the sound waves from the transducer. The sound waves will harmlessly bounce off your heart to allow the heart images to be captured in real-time motion. These images will then be recorded.  You may need an IV to receive a medicine that improves the quality of the pictures. What happens after the procedure? You may return to your normal schedule including diet, activities, and medicines, unless your health care provider tells you otherwise. This information is not intended to replace advice given to you by your health care provider. Make sure you discuss any questions you have with your health care provider. Document Released: 10/17/2000 Document Revised: 06/07/2016 Document Reviewed: 06/27/2013 Elsevier Interactive Patient Education  2017 [employer].

## 2018-08-19 ENCOUNTER — Ambulatory Visit: Payer: Self-pay | Admitting: Family Medicine

## 2018-08-23 ENCOUNTER — Ambulatory Visit: Payer: Self-pay | Admitting: Family Medicine

## 2018-08-24 ENCOUNTER — Encounter: Payer: Self-pay | Admitting: Cardiology

## 2018-08-26 ENCOUNTER — Encounter: Payer: Self-pay | Admitting: Family Medicine

## 2018-08-26 ENCOUNTER — Ambulatory Visit: Payer: Self-pay | Attending: Family Medicine | Admitting: Family Medicine

## 2018-08-26 VITALS — BP 152/75 | HR 59 | Temp 98.5°F | Ht 75.0 in | Wt 251.2 lb

## 2018-08-26 DIAGNOSIS — I428 Other cardiomyopathies: Secondary | ICD-10-CM

## 2018-08-26 DIAGNOSIS — Z79899 Other long term (current) drug therapy: Secondary | ICD-10-CM | POA: Insufficient documentation

## 2018-08-26 DIAGNOSIS — Z6831 Body mass index (BMI) 31.0-31.9, adult: Secondary | ICD-10-CM | POA: Insufficient documentation

## 2018-08-26 DIAGNOSIS — Z7984 Long term (current) use of oral hypoglycemic drugs: Secondary | ICD-10-CM | POA: Insufficient documentation

## 2018-08-26 DIAGNOSIS — I509 Heart failure, unspecified: Secondary | ICD-10-CM | POA: Insufficient documentation

## 2018-08-26 DIAGNOSIS — I110 Hypertensive heart disease with heart failure: Secondary | ICD-10-CM | POA: Insufficient documentation

## 2018-08-26 DIAGNOSIS — E119 Type 2 diabetes mellitus without complications: Secondary | ICD-10-CM

## 2018-08-26 DIAGNOSIS — I10 Essential (primary) hypertension: Secondary | ICD-10-CM

## 2018-08-26 LAB — GLUCOSE, POCT (MANUAL RESULT ENTRY): POC Glucose: 118 mg/dl — AB (ref 70–99)

## 2018-08-26 LAB — POCT GLYCOSYLATED HEMOGLOBIN (HGB A1C): HbA1c, POC (controlled diabetic range): 5.5 % (ref 0.0–7.0)

## 2018-08-26 MED ORDER — ATORVASTATIN CALCIUM 20 MG PO TABS: 20.0000 mg | ORAL_TABLET | Freq: Every day | ORAL | 3 refills | Status: DC

## 2018-08-26 NOTE — Patient Instructions (Signed)
Diabetes Mellitus and Nutrition When you have diabetes (diabetes mellitus), it is very important to have healthy eating habits because your blood sugar (glucose) levels are greatly affected by what you eat and drink. Eating healthy foods in the appropriate amounts, at about the same times every day, can help you:  Control your blood glucose.  Lower your risk of heart disease.  Improve your blood pressure.  Reach or maintain a healthy weight.  Every person with diabetes is different, and each person has different needs for a meal plan. Your health care provider may recommend that you work with a diet and nutrition specialist (dietitian) to make a meal plan that is best for you. Your meal plan may vary depending on factors such as:  The calories you need.  The medicines you take.  Your weight.  Your blood glucose, blood pressure, and cholesterol levels.  Your activity level.  Other health conditions you have, such as heart or kidney disease.  How do carbohydrates affect me? Carbohydrates affect your blood glucose level more than any other type of food. Eating carbohydrates naturally increases the amount of glucose in your blood. Carbohydrate counting is a method for keeping track of how many carbohydrates you eat. Counting carbohydrates is important to keep your blood glucose at a healthy level, especially if you use insulin or take certain oral diabetes medicines. It is important to know how many carbohydrates you can safely have in each meal. This is different for every person. Your dietitian can help you calculate how many carbohydrates you should have at each meal and for snack. Foods that contain carbohydrates include:  Bread, cereal, rice, pasta, and crackers.  Potatoes and corn.  Peas, beans, and lentils.  Milk and yogurt.  Fruit and juice.  Desserts, such as cakes, cookies, ice cream, and candy.  How does alcohol affect me? Alcohol can cause a sudden decrease in blood  glucose (hypoglycemia), especially if you use insulin or take certain oral diabetes medicines. Hypoglycemia can be a life-threatening condition. Symptoms of hypoglycemia (sleepiness, dizziness, and confusion) are similar to symptoms of having too much alcohol. If your health care provider says that alcohol is safe for you, follow these guidelines:  Limit alcohol intake to no more than 1 drink per day for nonpregnant women and 2 drinks per day for men. One drink equals 12 oz of beer, 5 oz of wine, or 1 oz of hard liquor.  Do not drink on an empty stomach.  Keep yourself hydrated with water, diet soda, or unsweetened iced tea.  Keep in mind that regular soda, juice, and other mixers may contain a lot of sugar and must be counted as carbohydrates.  What are tips for following this plan? Reading food labels  Start by checking the serving size on the label. The amount of calories, carbohydrates, fats, and other nutrients listed on the label are based on one serving of the food. Many foods contain more than one serving per package.  Check the total grams (g) of carbohydrates in one serving. You can calculate the number of servings of carbohydrates in one serving by dividing the total carbohydrates by 15. For example, if a food has 30 g of total carbohydrates, it would be equal to 2 servings of carbohydrates.  Check the number of grams (g) of saturated and trans fats in one serving. Choose foods that have low or no amount of these fats.  Check the number of milligrams (mg) of sodium in one serving. Most people   should limit total sodium intake to less than 2,300 mg per day.  Always check the nutrition information of foods labeled as "low-fat" or "nonfat". These foods may be higher in added sugar or refined carbohydrates and should be avoided.  Talk to your dietitian to identify your daily goals for nutrients listed on the label. Shopping  Avoid buying canned, premade, or processed foods. These  foods tend to be high in fat, sodium, and added sugar.  Shop around the outside edge of the grocery store. This includes fresh fruits and vegetables, bulk grains, fresh meats, and fresh dairy. Cooking  Use low-heat cooking methods, such as baking, instead of high-heat cooking methods like deep frying.  Cook using healthy oils, such as olive, canola, or sunflower oil.  Avoid cooking with butter, cream, or high-fat meats. Meal planning  Eat meals and snacks regularly, preferably at the same times every day. Avoid going long periods of time without eating.  Eat foods high in fiber, such as fresh fruits, vegetables, beans, and whole grains. Talk to your dietitian about how many servings of carbohydrates you can eat at each meal.  Eat 4-6 ounces of lean protein each day, such as lean meat, chicken, fish, eggs, or tofu. 1 ounce is equal to 1 ounce of meat, chicken, or fish, 1 egg, or 1/4 cup of tofu.  Eat some foods each day that contain healthy fats, such as avocado, nuts, seeds, and fish. Lifestyle   Check your blood glucose regularly.  Exercise at least 30 minutes 5 or more days each week, or as told by your health care provider.  Take medicines as told by your health care provider.  Do not use any products that contain nicotine or tobacco, such as cigarettes and e-cigarettes. If you need help quitting, ask your health care provider.  Work with a counselor or diabetes educator to identify strategies to manage stress and any emotional and social challenges. What are some questions to ask my health care provider?  Do I need to meet with a diabetes educator?  Do I need to meet with a dietitian?  What number can I call if I have questions?  When are the best times to check my blood glucose? Where to find more information:  American Diabetes Association: diabetes.org/food-and-fitness/food  Academy of Nutrition and Dietetics:  www.eatright.org/resources/health/diseases-and-conditions/diabetes  National Institute of Diabetes and Digestive and Kidney Diseases (NIH): www.niddk.nih.gov/health-information/diabetes/overview/diet-eating-physical-activity Summary  A healthy meal plan will help you control your blood glucose and maintain a healthy lifestyle.  Working with a diet and nutrition specialist (dietitian) can help you make a meal plan that is best for you.  Keep in mind that carbohydrates and alcohol have immediate effects on your blood glucose levels. It is important to count carbohydrates and to use alcohol carefully. This information is not intended to replace advice given to you by your health care provider. Make sure you discuss any questions you have with your health care provider. Document Released: 07/17/2005 Document Revised: 11/24/2016 Document Reviewed: 11/24/2016 Elsevier Interactive Patient Education  2018 Elsevier Inc.  

## 2018-08-26 NOTE — Progress Notes (Signed)
Subjective:  Patient ID: Jim Garcia, male    DOB: Apr 27, 1966  Age: 52 y.o. MRN: [id number]  CC: Diabetes   HPI Jim Garcia  is a 52 year old male with a history of hypertension, nonischemic cardiomyopathy (EF 25-30% from echo of 05/2018), Newly diagnosed type II DM (Aic 5.5) who presents today for a follow up visit. He was diagnosed with Diabetes at his last visit with an A1c of 7.3 and placed on Metformin which he has not been taking due to concerns of side effects but has been working on lifestyle modifications and his A1c has improved to 5.5. Denies hypoglycemia, neuropathy, visual concerns. He was also seen by the Clinical pharmacist for Diabetes Education. His blood pressure is elevated but he endorses compliance with his medications; his BP was controlled at his Cardiology visit. Last month he had a follow up visit with EP where ICD was discussed with the plan of repeating his echo in 2 months to assess cardiac function for improvement. The Patient is not keen on ICD at this time.  He has no chest pain, dyspnea, pedal edema. Past Medical History:  Diagnosis Date  . CHF (congestive heart failure) ([place])   . Hypertension     History reviewed. No pertinent surgical history.   Past Medical History:  Diagnosis Date  . CHF (congestive heart failure) ([place])   . Hypertension     History reviewed. No pertinent surgical history.   Outpatient Medications Prior to Visit  Medication Sig Dispense Refill  . Blood Glucose Monitoring Suppl (TRUE METRIX METER) w/Device KIT 1 Device by Does not apply route 3 (three) times daily. 1 kit 0  . carvedilol (COREG) 12.5 MG tablet Take 1 tablet (12.5 mg total) by mouth 2 (two) times daily with a meal. 180 tablet 3  . furosemide (LASIX) 80 MG tablet Take 1 tablet (80 mg total) by mouth daily. 90 tablet 3  . glucose blood (TRUE METRIX BLOOD GLUCOSE TEST) test strip Use as instructed 100 each 12  . potassium chloride SA (K-DUR,KLOR-CON) 20 MEQ  tablet Take 2 tablets (40 mEq total) by mouth daily. 90 tablet 3  . sacubitril-valsartan (ENTRESTO) 49-51 MG Take 1 tablet by mouth 2 (two) times daily. 180 tablet 3  . spironolactone (ALDACTONE) 25 MG tablet Take 1 tablet (25 mg total) by mouth daily. 90 tablet 3  . TRUEPLUS SAFETY LANCETS 28G MISC 1 Stick by Does not apply route 3 (three) times daily. 100 each 3  . metFORMIN (GLUCOPHAGE) 500 MG tablet Take 1 tablet (500 mg total) by mouth 2 (two) times daily with a meal. (Patient not taking: Reported on 08/26/2018) 60 tablet 3   No facility-administered medications prior to visit.     ROS Review of Systems  Constitutional: Negative for activity change and appetite change.  HENT: Negative for sinus pressure and sore throat.   Eyes: Negative for visual disturbance.  Respiratory: Negative for cough, chest tightness and shortness of breath.   Cardiovascular: Negative for chest pain and leg swelling.  Gastrointestinal: Negative for abdominal distention, abdominal pain, constipation and diarrhea.  Endocrine: Negative.   Genitourinary: Negative for dysuria.  Musculoskeletal: Negative for joint swelling and myalgias.  Skin: Negative for rash.  Allergic/Immunologic: Negative.   Neurological: Negative for weakness, light-headedness and numbness.  Psychiatric/Behavioral: Negative for dysphoric mood and suicidal ideas.    Objective:  BP (!) 152/75   Pulse (!) 59   Temp 98.5 F (36.9 C) (Oral)   Ht 6' 3"  (1.905 m)  Wt 251 lb 3.2 oz (113.9 kg)   SpO2 99%   BMI 31.40 kg/m   BP/Weight 08/26/2018 07/07/2018 2/86/3817  Systolic BP 711 657 903  Diastolic BP 75 70 86  Wt. (Lbs) 251.2 256 -  BMI 31.4 32 -      Physical Exam  Constitutional: He is oriented to person, place, and time. He appears well-developed and well-nourished.  Neck: No JVD present.  Cardiovascular: Normal rate, normal heart sounds and intact distal pulses.  No murmur heard. Pulmonary/Chest: Effort normal and breath  sounds normal. He has no wheezes. He has no rales. He exhibits no tenderness.  Abdominal: Soft. Bowel sounds are normal. He exhibits no distension and no mass. There is no tenderness.  Musculoskeletal: Normal range of motion.  Neurological: He is alert and oriented to person, place, and time.  Skin: Skin is warm and dry.  Psychiatric: He has a normal mood and affect.    Lab Results  Component Value Date   HGBA1C 5.5 08/26/2018      Assessment & Plan:   1. Type 2 diabetes mellitus without complication, without long-term current use of insulin (HCC) Controlled with A1c of 5.5 down from 7.3 Discontinue metformin as he wishes to remain on lifestyle modifictaions - POCT glucose (m Counseled on Diabetic diet, my plate method, 833 minutes of moderate intensity exercise/week Keep blood sugar logs with fasting goals of 80-120 mg/dl, random of less than 180 and in the event of sugars less than 60 mg/dl or greater than 400 mg/dl please notify the clinic ASAP. It is recommended that you undergo annual eye exams and annual foot exams. Pneumonia vaccine is recommended. nual entry) - POCT glycosylated hemoglobin (Hb A1C) - Microalbumin/Creatinine Ratio, Urine - Lipid panel - CMP14+EGFR  2. Essential hypertension Uncontrolled No regimen change today as his BP was 130/70 at his last visit with Cardiology Counseled on blood pressure goal of less than 130/80, low-sodium, DASH diet, medication compliance, 150 minutes of moderate intensity exercise per week. Discussed medication compliance, adverse effects.   3. Non-ischemic cardiomyopathy ([place]) EF 25-30% from 05/2018 He is holding off on ICD at this time Plan is for a repeat echo to evaluate cardiac function then  Discuss ICD if needed Continue Entresto, Lasix, Aldactone Follow up with Cardiology  4. Morbid obesity due to excess calories [facility]) He is working of weight loss, reducing portion sizes   Meds ordered this encounter  Medications   . atorvastatin (LIPITOR) 20 MG tablet    Sig: Take 1 tablet (20 mg total) by mouth daily.    Dispense:  30 tablet    Refill:  3    Follow-up: Return in about 3 months (around 11/26/2018) for Follow-up of chronic medical conditions.   Charlott Rakes MD

## 2018-08-27 LAB — CMP14+EGFR
A/G RATIO: 1.8 (ref 1.2–2.2)
ALT: 10 IU/L (ref 0–44)
AST: 13 IU/L (ref 0–40)
Albumin: 4.6 g/dL (ref 3.5–5.5)
Alkaline Phosphatase: 72 IU/L (ref 39–117)
BILIRUBIN TOTAL: 0.7 mg/dL (ref 0.0–1.2)
BUN / CREAT RATIO: 13 (ref 9–20)
BUN: 15 mg/dL (ref 6–24)
CHLORIDE: 103 mmol/L (ref 96–106)
CO2: 24 mmol/L (ref 20–29)
CREATININE: 1.15 mg/dL (ref 0.76–1.27)
Calcium: 9.6 mg/dL (ref 8.7–10.2)
GFR calc Af Amer: 85 mL/min/{1.73_m2} (ref >59)
GFR calc non Af Amer: 74 mL/min/{1.73_m2} (ref >59)
GLOBULIN, TOTAL: 2.5 g/dL (ref 1.5–4.5)
Glucose: 111 mg/dL — ABNORMAL HIGH (ref 65–99)
POTASSIUM: 4.3 mmol/L (ref 3.5–5.2)
SODIUM: 142 mmol/L (ref 134–144)
Total Protein: 7.1 g/dL (ref 6.0–8.5)

## 2018-08-27 LAB — LIPID PANEL
Chol/HDL Ratio: 4.5 ratio (ref 0.0–5.0)
Cholesterol, Total: 188 mg/dL (ref 100–199)
HDL: 42 mg/dL (ref >39)
LDL CALC: 127 mg/dL — AB (ref 0–99)
TRIGLYCERIDES: 94 mg/dL (ref 0–149)
VLDL CHOLESTEROL CAL: 19 mg/dL (ref 5–40)

## 2018-08-31 ENCOUNTER — Telehealth: Payer: Self-pay

## 2018-08-31 NOTE — Telephone Encounter (Signed)
Patient was called and informed of lab results. 

## 2018-08-31 NOTE — Telephone Encounter (Signed)
-----   Message from Hoy Register, MD sent at 08/27/2018 12:56 PM EDT ----- Cholesterol is elevated but I had commenced Lipitor at his most recent visit. Advise to comply with low cholesterol diet and lifestyle modifications.

## 2018-09-07 ENCOUNTER — Other Ambulatory Visit (HOSPITAL_BASED_OUTPATIENT_CLINIC_OR_DEPARTMENT_OTHER): Payer: Self-pay

## 2018-09-08 ENCOUNTER — Ambulatory Visit (HOSPITAL_BASED_OUTPATIENT_CLINIC_OR_DEPARTMENT_OTHER)
Admission: RE | Admit: 2018-09-08 | Discharge: 2018-09-08 | Disposition: A | Discharge status: Home / Self Care | Payer: Self-pay | Source: Ambulatory Visit | Attending: Cardiology | Admitting: Cardiology

## 2018-09-08 DIAGNOSIS — I428 Other cardiomyopathies: Secondary | ICD-10-CM | POA: Insufficient documentation

## 2018-09-13 ENCOUNTER — Ambulatory Visit: Payer: Self-pay | Admitting: Cardiology

## 2018-11-05 ENCOUNTER — Ambulatory Visit (HOSPITAL_BASED_OUTPATIENT_CLINIC_OR_DEPARTMENT_OTHER)
Admission: RE | Admit: 2018-11-05 | Discharge: 2018-11-05 | Disposition: A | Discharge status: Home / Self Care | Payer: BLUE CROSS/BLUE SHIELD | Source: Ambulatory Visit | Attending: Cardiology | Admitting: Cardiology

## 2018-11-05 DIAGNOSIS — I428 Other cardiomyopathies: Secondary | ICD-10-CM | POA: Insufficient documentation

## 2018-11-05 NOTE — Progress Notes (Signed)
Echocardiogram 2D Echocardiogram has been performed.  11/05/2018 Margreta Journey, RDCS

## 2018-11-08 ENCOUNTER — Ambulatory Visit: Payer: Self-pay | Admitting: Cardiology

## 2018-11-08 NOTE — Progress Notes (Deleted)
Electrophysiology Office Note   Date:  11/08/2018   ID:  Jim Garcia, DOB Jul 19, 1966, MRN [id number]  PCP:  Jim Rakes, MD  Cardiologist:  Garcia Primary Electrophysiologist:  Jim Hanssen Meredith Leeds, MD    No chief complaint on file.    History of Present Illness: Jim Garcia is a 53 y.o. male who is being seen today for the evaluation of CHF at the request of Jim Garcia. Presenting today for electrophysiology evaluation.  He has a history of nonischemic cardiomyopathy and congestive heart failure.  In 2016, he underwent coronary angiography which was essentially unremarkable with an ejection fraction of 30%.  He was admitted to the hospital June 2019 with congestive heart failure.  He has been following a strict diet since that time and has been compliant with his medications.  He has been weighing himself daily.    Today, he denies symptoms of palpitations, chest pain, shortness of breath, orthopnea, PND, lower extremity edema, claudication, dizziness, presyncope, syncope, bleeding, or neurologic sequela. The patient is tolerating medications without difficulties.  Works a strenuous job at a [employer].  He does not have much in the way of shortness of breath or fatigue.  He feels much better since being on his heart failure medications after his hospitalization.  He was noncompliant prior to that.   Past Medical History:  Diagnosis Date  . CHF (congestive heart failure) (Jim Garcia)   . Hypertension    No past surgical history on file.   Current Outpatient Medications  Medication Sig Dispense Refill  . atorvastatin (LIPITOR) 20 MG tablet Take 1 tablet (20 mg total) by mouth daily. 30 tablet 3  . Blood Glucose Monitoring Suppl (TRUE METRIX METER) w/Device KIT 1 Device by Does not apply route 3 (three) times daily. 1 kit 0  . carvedilol (COREG) 12.5 MG tablet Take 1 tablet (12.5 mg total) by mouth 2 (two) times daily with a meal. 180 tablet 3  . furosemide  (LASIX) 80 MG tablet Take 1 tablet (80 mg total) by mouth daily. 90 tablet 3  . glucose blood (TRUE METRIX BLOOD GLUCOSE TEST) test strip Use as instructed 100 each 12  . potassium chloride SA (K-DUR,KLOR-CON) 20 MEQ tablet Take 2 tablets (40 mEq total) by mouth daily. 90 tablet 3  . sacubitril-valsartan (ENTRESTO) 49-51 MG Take 1 tablet by mouth 2 (two) times daily. 180 tablet 3  . spironolactone (ALDACTONE) 25 MG tablet Take 1 tablet (25 mg total) by mouth daily. 90 tablet 3  . TRUEPLUS SAFETY LANCETS 28G MISC 1 Stick by Does not apply route 3 (three) times daily. 100 each 3   No current facility-administered medications for this visit.     Allergies:   Patient has no known allergies.   Social History:  The patient  reports that he has quit smoking. He has never used smokeless tobacco. He reports current alcohol use. He reports current drug use. Drug: Marijuana.   Family History:  The patient's family history includes Diabetes in his father and mother; Hypertension in his mother.    ROS:  Please see the history of present illness.   Otherwise, review of systems is positive for none.   All other systems are reviewed and negative.    PHYSICAL EXAM: VS:  There were no vitals taken for this visit. , BMI There is no height or weight on file to calculate BMI. GEN: Well nourished, well developed, in no acute distress  HEENT: normal  Neck: no JVD, carotid bruits,  or masses Cardiac: RRR; no murmurs, rubs, or gallops,no edema  Respiratory:  clear to auscultation bilaterally, normal work of breathing GI: soft, nontender, nondistended, + BS MS: no deformity or atrophy  Skin: warm and dry Neuro:  Strength and sensation are intact Psych: euthymic mood, full affect  EKG:  EKG is ordered today. Personal review of the ekg ordered shows this rhythm, nonspecific interventricular conduction block, QRS 130 ms, lateral T wave inversions, rate 63  Recent Labs: 04/14/2018: B Natriuretic Peptide  1,540.9; Hemoglobin 11.2; Platelets 339 08/26/2018: ALT 10; BUN 15; Creatinine, Ser 1.15; Potassium 4.3; Sodium 142    Lipid Panel     Component Value Date/Time   CHOL 188 08/26/2018 0935   TRIG 94 08/26/2018 0935   HDL 42 08/26/2018 0935   CHOLHDL 4.5 08/26/2018 0935   LDLCALC 127 (H) 08/26/2018 0935     Wt Readings from Last 3 Encounters:  08/26/18 251 lb 3.2 oz (113.9 kg)  07/07/18 256 lb (116.1 kg)  05/14/18 265 lb (120.2 kg)      Other studies Reviewed: Additional studies/ records that were reviewed today include: TTE 11/05/17 Review of the above records today demonstrates:  - Left ventricle: The cavity size was mildly dilated. Wall   thickness was increased in a pattern of moderate LVH. Systolic   function was moderately reduced. The estimated ejection fraction   was in the range of 35% to 40%. Diffuse hypokinesis. Features are   consistent with a pseudonormal left ventricular filling pattern,   with concomitant abnormal relaxation and increased filling   pressure (grade 2 diastolic dysfunction). - Pericardium, extracardiac: A trivial pericardial effusion was   identified posterior to the heart.     ASSESSMENT AND PLAN:  1.  Nonischemic cardiomyopathy: Currently on optimal medical therapy with Entresto, carvedilol, and Aldactone.  He had previously been noncompliant and only been compliant since his hospitalization in June.  He would like to wait to see if his ejection fraction Shamon Cothran improve further.  I Gottfried Standish get an echo in 2 months and we Devonna Oboyle see him back after that for further discussions of ICD therapy.  2.  Hypertension: ***  3.  Obstructive sleep apnea: CPAP compliance encouraged    Current medicines are reviewed at length with the patient today.   The patient does not have concerns regarding his medicines.  The following changes were made today:  ***  Labs/ tests ordered today include:  No orders of the defined types were placed in this  encounter.   Disposition:   FU with Brinna Divelbiss *** months  Signed, Skippy Marhefka Meredith Leeds, MD  11/08/2018 2:09 PM     [address] [address] [phone] (office) [phone] (fax)

## 2018-11-24 ENCOUNTER — Encounter: Payer: Self-pay | Admitting: Family Medicine

## 2018-11-24 ENCOUNTER — Ambulatory Visit: Payer: BLUE CROSS/BLUE SHIELD | Attending: Family Medicine | Admitting: Family Medicine

## 2018-11-24 VITALS — BP 170/88 | HR 54 | Temp 98.1°F | Ht 75.0 in | Wt 263.0 lb

## 2018-11-24 DIAGNOSIS — E669 Obesity, unspecified: Secondary | ICD-10-CM

## 2018-11-24 DIAGNOSIS — I10 Essential (primary) hypertension: Secondary | ICD-10-CM | POA: Diagnosis not present

## 2018-11-24 DIAGNOSIS — G4733 Obstructive sleep apnea (adult) (pediatric): Secondary | ICD-10-CM

## 2018-11-24 DIAGNOSIS — E66811 Obesity, class 1: Secondary | ICD-10-CM

## 2018-11-24 DIAGNOSIS — Z6832 Body mass index (BMI) 32.0-32.9, adult: Secondary | ICD-10-CM

## 2018-11-24 DIAGNOSIS — I428 Other cardiomyopathies: Secondary | ICD-10-CM

## 2018-11-24 DIAGNOSIS — E119 Type 2 diabetes mellitus without complications: Secondary | ICD-10-CM

## 2018-11-24 HISTORY — DX: Obesity, unspecified: E66.9

## 2018-11-24 HISTORY — DX: Obesity, class 1: E66.811

## 2018-11-24 LAB — GLUCOSE, POCT (MANUAL RESULT ENTRY): POC Glucose: 181 mg/dl — AB (ref 70–99)

## 2018-11-24 MED ORDER — ATORVASTATIN CALCIUM 20 MG PO TABS: 20.0000 mg | ORAL_TABLET | Freq: Every day | ORAL | 6 refills | Status: DC

## 2018-11-24 MED ORDER — PNEUMOCOCCAL VAC POLYVALENT 25 MCG/0.5ML IJ INJ: 0.5000 mL | INJECTION | Freq: Once | INTRAMUSCULAR | 0 refills | Status: AC

## 2018-11-24 MED ORDER — HYDRALAZINE HCL 50 MG PO TABS: 50.0000 mg | ORAL_TABLET | Freq: Two times a day (BID) | ORAL | 6 refills | Status: DC

## 2018-11-24 MED FILL — PNEUMOVAX 23 VIAL: 25 | 1 days supply | Qty: 1 | Fill #0

## 2018-11-24 NOTE — Patient Instructions (Signed)
Obesity, Adult Obesity is having too much body fat. If you have a BMI of 30 or more, you are obese. BMI is a number that explains how much body fat you have. Obesity is often caused by taking in (consuming) more calories than your body uses. Obesity can cause serious health problems. Changing your lifestyle can help to treat obesity. Follow these instructions at home: Eating and drinking   Follow advice from your doctor about what to eat and drink. Your doctor may tell you to: ? Cut down on (limit) fast foods, sweets, and processed snack foods. ? Choose low-fat options. For example, choose low-fat milk instead of whole milk. ? Eat 5 or more servings of fruits or vegetables every day. ? Eat at home more often. This gives you more control over what you eat. ? Choose healthy foods when you eat out. ? Learn what a healthy portion size is. A portion size is the amount of a certain food that is healthy for you to eat at one time. This is different for each person. ? Keep low-fat snacks available. ? Avoid sugary drinks. These include soda, fruit juice, iced tea that is sweetened with sugar, and flavored milk. ? Eat a healthy breakfast.  Drink enough water to keep your pee (urine) clear or pale yellow.  Do not go without eating for long periods of time (do not fast).  Do not go on popular or trendy diets (fad diets). Physical Activity  Exercise often, as told by your doctor. Ask your doctor: ? What types of exercise are safe for you. ? How often you should exercise.  Warm up and stretch before being active.  Do slow stretching after being active (cool down).  Rest between times of being active. Lifestyle  Limit how much time you spend in front of your TV, computer, or video game system (be less sedentary).  Find ways to reward yourself that do not involve food.  Limit alcohol intake to no more than 1 drink a day for nonpregnant women and 2 drinks a day for men. One drink equals 12 oz  of beer, 5 oz of wine, or 1 oz of hard liquor. General instructions  Keep a weight loss journal. This can help you keep track of: ? The food that you eat. ? The exercise that you do.  Take over-the-counter and prescription medicines only as told by your doctor.  Take vitamins and supplements only as told by your doctor.  Think about joining a support group. Your doctor may be able to help with this.  Keep all follow-up visits as told by your doctor. This is important. Contact a doctor if:  You cannot meet your weight loss goal after you have changed your diet and lifestyle for 6 weeks. This information is not intended to replace advice given to you by your health care provider. Make sure you discuss any questions you have with your health care provider. Document Released: 01/12/2012 Document Revised: 03/27/2016 Document Reviewed: 08/08/2015 Elsevier Interactive Patient Education  2019 Elsevier Inc.  

## 2018-11-24 NOTE — Progress Notes (Signed)
Subjective:  Patient ID: Jim Garcia, male    DOB: 05/23/1966  Age: 53 y.o. MRN: [id number]  CC: Diabetes   HPI Jim Garcia is a 53 year old male with a history of hypertension, nonischemic cardiomyopathy (EF 35 to 40% in 11/2018 which has improved from 25 to 30% previously), Newly diagnosed type II DM (Aic 5.5) who presents today for a follow up visit. He has gained 12 pounds since his last office visit and endorses not being as active and overindulged over the holidays.  He denies pedal edema, dyspnea, chest pain, orthopnea but complains of urinating a lot which he attributes to his Lasix.  He needs to make an appointment for follow-up with his cardiologist. He recently had an echocardiogram which revealed: Echo 11/05/2018: Study Conclusions  - Left ventricle: The cavity size was mildly dilated. Wall   thickness was increased in a pattern of moderate LVH. Systolic   function was moderately reduced. The estimated ejection fraction   was in the range of 35% to 40%. Diffuse hypokinesis. Features are   consistent with a pseudonormal left ventricular filling pattern,   with concomitant abnormal relaxation and increased filling   pressure (grade 2 diastolic dysfunction). - Pericardium, extracardiac: A trivial pericardial effusion was   identified posterior to the heart.   With regards to his diabetes mellitus he has been on diet control and denies hypoglycemia, numbness in extremities or visual concerns. Tolerating his antihypertensive but has not been compliant with his statin due to cost but he now has medical coverage and should be able to afford it. His blood pressure is elevated despite compliance with his antihypertensive.  Past Medical History:  Diagnosis Date  . CHF (congestive heart failure) ([place])   . Hypertension     History reviewed. No pertinent surgical history.  No Known Allergies   Outpatient Medications Prior to Visit  Medication Sig Dispense Refill  .  Blood Glucose Monitoring Suppl (TRUE METRIX METER) w/Device KIT 1 Device by Does not apply route 3 (three) times daily. 1 kit 0  . carvedilol (COREG) 12.5 MG tablet Take 1 tablet (12.5 mg total) by mouth 2 (two) times daily with a meal. 180 tablet 3  . furosemide (LASIX) 80 MG tablet Take 1 tablet (80 mg total) by mouth daily. 90 tablet 3  . glucose blood (TRUE METRIX BLOOD GLUCOSE TEST) test strip Use as instructed 100 each 12  . potassium chloride SA (K-DUR,KLOR-CON) 20 MEQ tablet Take 2 tablets (40 mEq total) by mouth daily. 90 tablet 3  . sacubitril-valsartan (ENTRESTO) 49-51 MG Take 1 tablet by mouth 2 (two) times daily. 180 tablet 3  . spironolactone (ALDACTONE) 25 MG tablet Take 1 tablet (25 mg total) by mouth daily. 90 tablet 3  . TRUEPLUS SAFETY LANCETS 28G MISC 1 Stick by Does not apply route 3 (three) times daily. 100 each 3  . atorvastatin (LIPITOR) 20 MG tablet Take 1 tablet (20 mg total) by mouth daily. (Patient not taking: Reported on 11/24/2018) 30 tablet 3   No facility-administered medications prior to visit.     ROS Review of Systems  Constitutional: Negative for activity change and appetite change.  HENT: Negative for sinus pressure and sore throat.   Eyes: Negative for visual disturbance.  Respiratory: Negative for cough, chest tightness and shortness of breath.   Cardiovascular: Negative for chest pain and leg swelling.  Gastrointestinal: Negative for abdominal distention, abdominal pain, constipation and diarrhea.  Endocrine: Negative.   Genitourinary: Negative for dysuria.  Musculoskeletal: Negative  for joint swelling and myalgias.  Skin: Negative for rash.  Allergic/Immunologic: Negative.   Neurological: Negative for weakness, light-headedness and numbness.  Psychiatric/Behavioral: Negative for dysphoric mood and suicidal ideas.    Objective:  BP (!) 170/88   Pulse (!) 54   Temp 98.1 F (36.7 C) (Oral)   Ht 6' 3" (1.905 m)   Wt 263 lb (119.3 kg)   SpO2  100%   BMI 32.87 kg/m   BP/Weight 11/24/2018 56/21/3086 03/09/8468  Systolic BP 629 528 413  Diastolic BP 88 75 70  Wt. (Lbs) 263 251.2 256  BMI 32.87 31.4 32      Physical Exam Constitutional:      Appearance: He is well-developed. He is obese.  Neck:     Comments: No JVD Cardiovascular:     Rate and Rhythm: Bradycardia present.     Heart sounds: Normal heart sounds. No murmur.  Pulmonary:     Effort: Pulmonary effort is normal.     Breath sounds: Normal breath sounds. No wheezing or rales.  Chest:     Chest wall: No tenderness.  Abdominal:     General: Bowel sounds are normal. There is no distension.     Palpations: Abdomen is soft. There is no mass.     Tenderness: There is no abdominal tenderness.  Musculoskeletal: Normal range of motion.  Neurological:     Mental Status: He is alert and oriented to person, place, and time.     CMP Latest Ref Rng & Units 08/26/2018 05/14/2018 04/27/2018  Glucose 65 - 99 mg/dL 111(H) 96 121(H)  BUN 6 - 24 mg/dL _0 Creatinine 0.76 - 1.27 mg/dL 1.15 1.20 1.15  Sodium 134 - 144 mmol/L 142 144 140  Potassium 3.5 - 5.2 mmol/L 4.3 4.3 4.7  Chloride 96 - 106 mmol/L 103 106 102  CO2 20 - 29 mmol/L _1 Calcium 8.7 - 10.2 mg/dL 9.6 9.8 9.7  Total Protein 6.0 - 8.5 g/dL 7.1 7.0 -  Total Bilirubin 0.0 - 1.2 mg/dL 0.7 0.8 -  Alkaline Phos 39 - 117 IU/L 72 84 -  AST 0 - 40 IU/L 13 12 -  ALT 0 - 44 IU/L 10 13 -    Lipid Panel     Component Value Date/Time   CHOL 188 08/26/2018 0935   TRIG 94 08/26/2018 0935   HDL 42 08/26/2018 0935   CHOLHDL 4.5 08/26/2018 0935   LDLCALC 127 (H) 08/26/2018 0935    Lab Results  Component Value Date   HGBA1C 5.5 08/26/2018    Assessment & Plan:   1. Type 2 diabetes mellitus without complication, without long-term current use of insulin (HCC) Diet controlled with A1c of 5.5 Counseled on Diabetic diet, my plate method, 244 minutes of moderate intensity exercise/week Keep blood sugar  logs with fasting goals of 80-120 mg/dl, random of less than 180 and in the event of sugars less than 60 mg/dl or greater than 400 mg/dl please notify the clinic ASAP. It is recommended that you undergo annual eye exams and annual foot exams. Pneumonia vaccine is recommended. - POCT glucose (manual entry) - Hemoglobin A1c - Microalbumin/Creatinine Ratio, Urine - pneumococcal 23 valent vaccine (PNU-IMMUNE) 25 MCG/0.5ML injection; Inject 0.5 mLs into the muscle once for 1 dose.  Dispense: 2.5 mL; Refill: 0  2. Essential hypertension Uncontrolled Hydralazine added to regimen Counseled on blood pressure goal of less than 130/80, low-sodium, DASH diet, medication compliance, 150 minutes of moderate intensity exercise  per week. Discussed medication compliance, adverse effects. - Basic Metabolic Panel  3. Non-ischemic cardiomyopathy ([place]) EF of 35 to 40% from 11/2018 which has improved from 25 to 30% 6 months prior Euvolemic - atorvastatin (LIPITOR) 20 MG tablet; Take 1 tablet (20 mg total) by mouth daily.  Dispense: 30 tablet; Refill: 6 - Brain natriuretic peptide  4. Obesity (BMI 30.0-34.9) Gained 12 pounds in the last 3 months Discussed weight loss Increasing physical activity, restricting caloric intake  5. Sleep apnea, obstructive Controlled on CPAP   Meds ordered this encounter  Medications  . atorvastatin (LIPITOR) 20 MG tablet    Sig: Take 1 tablet (20 mg total) by mouth daily.    Dispense:  30 tablet    Refill:  6  . hydrALAZINE (APRESOLINE) 50 MG tablet    Sig: Take 1 tablet (50 mg total) by mouth 2 (two) times daily.    Dispense:  60 tablet    Refill:  6  . pneumococcal 23 valent vaccine (PNU-IMMUNE) 25 MCG/0.5ML injection    Sig: Inject 0.5 mLs into the muscle once for 1 dose.    Dispense:  2.5 mL    Refill:  0    Follow-up: Return in about 3 months (around 02/23/2019) for Follow-up of chronic medical conditions.   Charlott Rakes MD

## 2018-11-25 LAB — BASIC METABOLIC PANEL
BUN/Creatinine Ratio: 12 (ref 9–20)
BUN: 14 mg/dL (ref 6–24)
CO2: 22 mmol/L (ref 20–29)
CREATININE: 1.19 mg/dL (ref 0.76–1.27)
Calcium: 9.2 mg/dL (ref 8.7–10.2)
Chloride: 102 mmol/L (ref 96–106)
GFR calc Af Amer: 82 mL/min/{1.73_m2} (ref >59)
GFR calc non Af Amer: 71 mL/min/{1.73_m2} (ref >59)
GLUCOSE: 104 mg/dL — AB (ref 65–99)
Potassium: 4.5 mmol/L (ref 3.5–5.2)
Sodium: 143 mmol/L (ref 134–144)

## 2018-11-25 LAB — HEMOGLOBIN A1C
ESTIMATED AVERAGE GLUCOSE: 126 mg/dL
Hgb A1c MFr Bld: 6 % — ABNORMAL HIGH (ref 4.8–5.6)

## 2018-11-25 LAB — MICROALBUMIN / CREATININE URINE RATIO
CREATININE, UR: 208.4 mg/dL
Microalb/Creat Ratio: 12 mg/g creat (ref 0–29)
Microalbumin, Urine: 24 ug/mL

## 2018-11-25 LAB — BRAIN NATRIURETIC PEPTIDE: BNP: 11.3 pg/mL (ref 0.0–100.0)

## 2018-12-03 ENCOUNTER — Telehealth: Payer: Self-pay

## 2018-12-03 NOTE — Telephone Encounter (Signed)
Patient was called and informed of lab results. 

## 2018-12-03 NOTE — Telephone Encounter (Signed)
-----   Message from Hoy Register, MD sent at 11/29/2018  5:26 PM EST ----- A1c is 6.0 which is down from 7.3 previously.  Please continue current regimen.  Urine test is normal

## 2019-02-23 ENCOUNTER — Other Ambulatory Visit: Payer: Self-pay

## 2019-02-23 ENCOUNTER — Encounter: Payer: Self-pay | Admitting: Family Medicine

## 2019-02-23 ENCOUNTER — Ambulatory Visit: Payer: BLUE CROSS/BLUE SHIELD | Attending: Family Medicine | Admitting: Family Medicine

## 2019-02-23 DIAGNOSIS — I10 Essential (primary) hypertension: Secondary | ICD-10-CM | POA: Diagnosis not present

## 2019-02-23 DIAGNOSIS — E119 Type 2 diabetes mellitus without complications: Secondary | ICD-10-CM

## 2019-02-23 DIAGNOSIS — I428 Other cardiomyopathies: Secondary | ICD-10-CM | POA: Diagnosis not present

## 2019-02-23 DIAGNOSIS — E669 Obesity, unspecified: Secondary | ICD-10-CM

## 2019-02-23 MED ORDER — FUROSEMIDE 80 MG PO TABS: 80.0000 mg | ORAL_TABLET | Freq: Every day | ORAL | 1 refills | Status: DC

## 2019-02-23 MED ORDER — CARVEDILOL 12.5 MG PO TABS: 12.5000 mg | ORAL_TABLET | Freq: Two times a day (BID) | ORAL | 1 refills | Status: DC

## 2019-02-23 MED ORDER — ATORVASTATIN CALCIUM 20 MG PO TABS: 20.0000 mg | ORAL_TABLET | Freq: Every day | ORAL | 1 refills | Status: DC

## 2019-02-23 MED ORDER — HYDRALAZINE HCL 50 MG PO TABS: 50.0000 mg | ORAL_TABLET | Freq: Two times a day (BID) | ORAL | 1 refills | Status: DC

## 2019-02-23 MED ORDER — POTASSIUM CHLORIDE CRYS ER 20 MEQ PO TBCR: 40.0000 meq | EXTENDED_RELEASE_TABLET | Freq: Every day | ORAL | 1 refills | Status: DC

## 2019-02-23 MED ORDER — SACUBITRIL-VALSARTAN 49-51 MG PO TABS: 1.0000 | ORAL_TABLET | Freq: Two times a day (BID) | ORAL | 3 refills | Status: DC

## 2019-02-23 MED ORDER — SPIRONOLACTONE 25 MG PO TABS: 25.0000 mg | ORAL_TABLET | Freq: Every day | ORAL | 1 refills | Status: DC

## 2019-02-23 NOTE — Progress Notes (Signed)
Patient has been called and DOB has been verified. Patient has been screened and transferred to PCP to start phone visit.  C/C: diabetes  Refills: Potassium.

## 2019-02-23 NOTE — Progress Notes (Signed)
Virtual Visit via Telephone Note  I connected with Jim Garcia, on 02/23/2019 at 9:04 AM by telephone and verified that I am speaking with the correct person using two identifiers.   Consent: I discussed the limitations, risks, security and privacy concerns of performing an evaluation and management service by telephone and the availability of in person appointments. I also discussed with the patient that there may be a patient responsible charge related to this service. The patient expressed understanding and agreed to proceed.   Location of Patient: Patient's car  Location of Provider: Clinic   Persons participating in Telemedicine visit: Jim Garcia-CMA Dr. Felecia Shelling     History of Present Illness: Jim Garcia is a 53 year old male with a history of hypertension, nonischemic cardiomyopathy (EF 35 to 40% in 11/2018 which has improved from 25 to 30% in 05/2018), Type II DM (A1c 6.0) who presents today for a follow up visit.  His highest fasting sugar has been 106 and he denies hypoglycemia. Last blood pressure check was 125/92 and current weight is 268 which is up 5 pounds from 3 months ago.  He endorses noncompliance with restricted fluid intake but promises to do better.  He has also started walking every day. He has had some pedal edema which is present in the dependent position but resolves on elevation and he denies dyspnea, chest pain.  Compliant with his medications and is requesting refills on everything. He denies additional concerns today.   Past Medical History:  Diagnosis Date  . CHF (congestive heart failure) ([place])   . Hypertension    No Known Allergies  Current Outpatient Medications on File Prior to Visit  Medication Sig Dispense Refill  . atorvastatin (LIPITOR) 20 MG tablet Take 1 tablet (20 mg total) by mouth daily. 30 tablet 6  . Blood Glucose Monitoring Suppl (TRUE METRIX METER) w/Device KIT 1 Device by Does not apply route 3  (three) times daily. 1 kit 0  . carvedilol (COREG) 12.5 MG tablet Take 1 tablet (12.5 mg total) by mouth 2 (two) times daily with a meal. 180 tablet 3  . furosemide (LASIX) 80 MG tablet Take 1 tablet (80 mg total) by mouth daily. 90 tablet 3  . glucose blood (TRUE METRIX BLOOD GLUCOSE TEST) test strip Use as instructed 100 each 12  . hydrALAZINE (APRESOLINE) 50 MG tablet Take 1 tablet (50 mg total) by mouth 2 (two) times daily. 60 tablet 6  . potassium chloride SA (K-DUR,KLOR-CON) 20 MEQ tablet Take 2 tablets (40 mEq total) by mouth daily. 90 tablet 3  . sacubitril-valsartan (ENTRESTO) 49-51 MG Take 1 tablet by mouth 2 (two) times daily. 180 tablet 3  . spironolactone (ALDACTONE) 25 MG tablet Take 1 tablet (25 mg total) by mouth daily. 90 tablet 3  . TRUEPLUS SAFETY LANCETS 28G MISC 1 Stick by Does not apply route 3 (three) times daily. 100 each 3   No current facility-administered medications on file prior to visit.     Observations/Objective: Awake, alert, oriented x3 Not in acute distress  CMP Latest Ref Rng & Units 11/24/2018 08/26/2018 05/14/2018  Glucose 65 - 99 mg/dL 104(H) 111(H) 96  BUN 6 - 24 mg/dL 14 15 18   Creatinine 0.76 - 1.27 mg/dL 1.19 1.15 1.20  Sodium 134 - 144 mmol/L 143 142 144  Potassium 3.5 - 5.2 mmol/L 4.5 4.3 4.3  Chloride 96 - 106 mmol/L 102 103 106  CO2 20 - 29 mmol/L 22 24 21   Calcium 8.7 - 10.2  mg/dL 9.2 9.6 9.8  Total Protein 6.0 - 8.5 g/dL - 7.1 7.0  Total Bilirubin 0.0 - 1.2 mg/dL - 0.7 0.8  Alkaline Phos 39 - 117 IU/L - 72 84  AST 0 - 40 IU/L - 13 12  ALT 0 - 44 IU/L - 10 13    Lipid Panel     Component Value Date/Time   CHOL 188 08/26/2018 0935   TRIG 94 08/26/2018 0935   HDL 42 08/26/2018 0935   CHOLHDL 4.5 08/26/2018 0935   LDLCALC 127 (H) 08/26/2018 0935    Lab Results  Component Value Date   HGBA1C 6.0 (H) 11/24/2018    Assessment and Plan: 1. Essential hypertension Advised to keep blood pressure log at home Counseled on blood  pressure goal of less than 130/80, low-sodium, DASH diet, medication compliance, 150 minutes of moderate intensity exercise per week. Discussed medication compliance, adverse effects. - carvedilol (COREG) 12.5 MG tablet; Take 1 tablet (12.5 mg total) by mouth 2 (two) times daily with a meal.  Dispense: 180 tablet; Refill: 1  2. Non-ischemic cardiomyopathy ([place]) EF 35 to 40% He has gained some weight and we have discussed limiting fluid intake, daily weights and he will notify the clinic in the event of weight gain of greater than 5 pounds in 1 week or greater than 3 pounds in 1 day. - spironolactone (ALDACTONE) 25 MG tablet; Take 1 tablet (25 mg total) by mouth daily.  Dispense: 90 tablet; Refill: 1 - sacubitril-valsartan (ENTRESTO) 49-51 MG; Take 1 tablet by mouth 2 (two) times daily.  Dispense: 180 tablet; Refill: 3 - hydrALAZINE (APRESOLINE) 50 MG tablet; Take 1 tablet (50 mg total) by mouth 2 (two) times daily.  Dispense: 180 tablet; Refill: 1 - furosemide (LASIX) 80 MG tablet; Take 1 tablet (80 mg total) by mouth daily.  Dispense: 90 tablet; Refill: 1 - atorvastatin (LIPITOR) 20 MG tablet; Take 1 tablet (20 mg total) by mouth daily.  Dispense: 90 tablet; Refill: 1  3. Type 2 diabetes mellitus without complication, without long-term current use of insulin (HCC) Controlled with A1c of 6.0 Counseled on Diabetic diet, my plate method, 250 minutes of moderate intensity exercise/week Keep blood sugar logs with fasting goals of 80-120 mg/dl, random of less than 180 and in the event of sugars less than 60 mg/dl or greater than 400 mg/dl please notify the clinic ASAP. It is recommended that you undergo annual eye exams and annual foot exams. Pneumonia vaccine is recommended.  4. Obesity (BMI 30.0-34.9) Reduce portion sizes, increase physical activity   Follow Up Instructions: 3 Months   I discussed the assessment and treatment plan with the patient. The patient was provided an opportunity to  ask questions and all were answered. The patient agreed with the plan and demonstrated an understanding of the instructions.   The patient was advised to call back or seek an in-person evaluation if the symptoms worsen or if the condition fails to improve as anticipated.     I provided 15 minutes total of non-face-to-face time during this encounter including median intraservice time, reviewing previous notes, labs, imaging, medications and explaining diagnosis and management.     Charlott Rakes, MD, FAAFP. [facility] and [place], [place]   02/23/2019, 9:04 AM

## 2019-05-30 ENCOUNTER — Other Ambulatory Visit: Payer: Self-pay

## 2019-05-30 ENCOUNTER — Ambulatory Visit: Payer: BLUE CROSS/BLUE SHIELD | Attending: Family Medicine | Admitting: Family Medicine

## 2019-05-30 ENCOUNTER — Encounter: Payer: Self-pay | Admitting: Family Medicine

## 2019-05-30 DIAGNOSIS — I428 Other cardiomyopathies: Secondary | ICD-10-CM

## 2019-05-30 DIAGNOSIS — I10 Essential (primary) hypertension: Secondary | ICD-10-CM | POA: Diagnosis not present

## 2019-05-30 DIAGNOSIS — Z1211 Encounter for screening for malignant neoplasm of colon: Secondary | ICD-10-CM

## 2019-05-30 DIAGNOSIS — E119 Type 2 diabetes mellitus without complications: Secondary | ICD-10-CM

## 2019-05-30 NOTE — Progress Notes (Signed)
Virtual Visit via Telephone Note  I connected with [name], on 05/30/2019 at 11:00 AM by telephone due to the COVID-19 pandemic and verified that I am speaking with the correct person using two identifiers.   Consent: I discussed the limitations, risks, security and privacy concerns of performing an evaluation and management service by telephone and the availability of in person appointments. I also discussed with the patient that there may be a patient responsible charge related to this service. The patient expressed understanding and agreed to proceed.   Location of Patient: Home  Location of Provider: Clinic   Persons participating in Telemedicine visit: Sherod Cisse Farrington-CMA Dr. Felecia Shelling     History of Present Illness: Jim Garcia is a 53 year old male with a history of hypertension, nonischemic cardiomyopathy (EF 35 to 40% in 11/2018 which has improved from 25 to 30% in 05/2018), Type II DM (A1c 6.0) who presents today for a follow up visit.  He is doing well but has gained some more weight; now weighs 272 lbs (11 lbs in the last 6 months) Wondering if his diuretic needs to be increased however he denies dyspnea or orthopnea.. Ankle edema is present which is decreased in the morning. He struggles with fluid restriction due to the heat and drinks a lot of water when his thirst is not quenched by ice. With regards to his DM he has no hypoglycemia, blurry vision, numbness of his extremities. He is due for a Colonoscopy.   Past Medical History:  Diagnosis Date  . CHF (congestive heart failure) ([place])   . Hypertension    No Known Allergies  Current Outpatient Medications on File Prior to Visit  Medication Sig Dispense Refill  . atorvastatin (LIPITOR) 20 MG tablet Take 1 tablet (20 mg total) by mouth daily. 90 tablet 1  . Blood Glucose Monitoring Suppl (TRUE METRIX METER) w/Device KIT 1 Device by Does not apply route 3 (three) times daily. 1 kit 0  .  carvedilol (COREG) 12.5 MG tablet Take 1 tablet (12.5 mg total) by mouth 2 (two) times daily with a meal. 180 tablet 1  . furosemide (LASIX) 80 MG tablet Take 1 tablet (80 mg total) by mouth daily. 90 tablet 1  . glucose blood (TRUE METRIX BLOOD GLUCOSE TEST) test strip Use as instructed 100 each 12  . hydrALAZINE (APRESOLINE) 50 MG tablet Take 1 tablet (50 mg total) by mouth 2 (two) times daily. 180 tablet 1  . potassium chloride SA (K-DUR) 20 MEQ tablet Take 2 tablets (40 mEq total) by mouth daily. 180 tablet 1  . sacubitril-valsartan (ENTRESTO) 49-51 MG Take 1 tablet by mouth 2 (two) times daily. 180 tablet 3  . spironolactone (ALDACTONE) 25 MG tablet Take 1 tablet (25 mg total) by mouth daily. 90 tablet 1  . TRUEPLUS SAFETY LANCETS 28G MISC 1 Stick by Does not apply route 3 (three) times daily. 100 each 3   No current facility-administered medications on file prior to visit.     Observations/Objective: AAOx3 Not in acute distress  CMP Latest Ref Rng & Units 11/24/2018 08/26/2018 05/14/2018  Glucose 65 - 99 mg/dL 104(H) 111(H) 96  BUN 6 - 24 mg/dL _0 Creatinine 0.76 - 1.27 mg/dL 1.19 1.15 1.20  Sodium 134 - 144 mmol/L 143 142 144  Potassium 3.5 - 5.2 mmol/L 4.5 4.3 4.3  Chloride 96 - 106 mmol/L 102 103 106  CO2 20 - 29 mmol/L _1 Calcium 8.7 - 10.2 mg/dL 9.2  9.6 9.8  Total Protein 6.0 - 8.5 g/dL - 7.1 7.0  Total Bilirubin 0.0 - 1.2 mg/dL - 0.7 0.8  Alkaline Phos 39 - 117 IU/L - 72 84  AST 0 - 40 IU/L - 13 12  ALT 0 - 44 IU/L - 10 13    Lipid Panel     Component Value Date/Time   CHOL 188 08/26/2018 0935   TRIG 94 08/26/2018 0935   HDL 42 08/26/2018 0935   CHOLHDL 4.5 08/26/2018 0935   LDLCALC 127 (H) 08/26/2018 0935    Lab Results  Component Value Date   HGBA1C 6.0 (H) 11/24/2018     Assessment and Plan: 1. Essential hypertension Controlled Counseled on blood pressure goal of less than 130/80, low-sodium, DASH diet, medication compliance, 150 minutes  of moderate intensity exercise per week. Discussed medication compliance, adverse effects.  2. Screening for colon cancer - Ambulatory referral to Gastroenterology  3. Type 2 diabetes mellitus without complication, without long-term current use of insulin (HCC) Controlled with A1c of 6.0 Counseled on Diabetic diet, my plate method, 572 minutes of moderate intensity exercise/week Keep blood sugar logs with fasting goals of 80-120 mg/dl, random of less than 180 and in the event of sugars less than 60 mg/dl or greater than 400 mg/dl please notify the clinic ASAP. It is recommended that you undergo annual eye exams and annual foot exams. Pneumonia vaccine is recommended. - Hemoglobin A1c; Future - Lipid panel; Future - CMP14+EGFR; Future - Ambulatory referral to Ophthalmology  4. Non-ischemic cardiomyopathy (HCC) EF of 35-40% He continues to gain weight Will send off BNP and adjust Lasix dose if indicated He struggles with fluid restriction due to the heat. - Brain natriuretic peptide; Future   Follow Up Instructions: 3 months   I discussed the assessment and treatment plan with the patient. The patient was provided an opportunity to ask questions and all were answered. The patient agreed with the plan and demonstrated an understanding of the instructions.   The patient was advised to call back or seek an in-person evaluation if the symptoms worsen or if the condition fails to improve as anticipated.     I provided 20 minutes total of non-face-to-face time during this encounter including median intraservice time, reviewing previous notes, labs, imaging, medications, management and patient verbalized understanding.     Charlott Rakes, MD, FAAFP. [facility] and [place], [place]   05/30/2019, 11:00 AM

## 2019-05-30 NOTE — Progress Notes (Signed)
Patient has been called and DOB has been verified. Patient has been screened and transferred to PCP to start phone visit.     

## 2019-06-15 ENCOUNTER — Ambulatory Visit: Payer: BLUE CROSS/BLUE SHIELD | Attending: Family Medicine

## 2019-06-15 ENCOUNTER — Other Ambulatory Visit: Payer: Self-pay

## 2019-06-15 DIAGNOSIS — I428 Other cardiomyopathies: Secondary | ICD-10-CM

## 2019-06-15 DIAGNOSIS — E119 Type 2 diabetes mellitus without complications: Secondary | ICD-10-CM

## 2019-06-16 LAB — CMP14+EGFR
ALT: 13 IU/L (ref 0–44)
AST: 13 IU/L (ref 0–40)
Albumin/Globulin Ratio: 2 (ref 1.2–2.2)
Albumin: 4.6 g/dL (ref 4.0–5.0)
Alkaline Phosphatase: 83 IU/L (ref 39–117)
BUN/Creatinine Ratio: 12 (ref 9–20)
BUN: 16 mg/dL (ref 6–24)
Bilirubin Total: 0.7 mg/dL (ref 0.0–1.2)
CO2: 24 mmol/L (ref 20–29)
Calcium: 9.5 mg/dL (ref 8.7–10.2)
Chloride: 99 mmol/L (ref 96–106)
Creatinine, Ser: 1.29 mg/dL — ABNORMAL HIGH (ref 0.76–1.27)
GFR calc Af Amer: 74 mL/min/{1.73_m2} (ref >59)
GFR calc non Af Amer: 64 mL/min/{1.73_m2} (ref >59)
Globulin, Total: 2.3 g/dL (ref 1.5–4.5)
Glucose: 131 mg/dL — ABNORMAL HIGH (ref 65–99)
Potassium: 4.5 mmol/L (ref 3.5–5.2)
Sodium: 140 mmol/L (ref 134–144)
Total Protein: 6.9 g/dL (ref 6.0–8.5)

## 2019-06-16 LAB — HEMOGLOBIN A1C
Est. average glucose Bld gHb Est-mCnc: 140 mg/dL
Hgb A1c MFr Bld: 6.5 % — ABNORMAL HIGH (ref 4.8–5.6)

## 2019-06-16 LAB — LIPID PANEL
Chol/HDL Ratio: 3.6 ratio (ref 0.0–5.0)
Cholesterol, Total: 143 mg/dL (ref 100–199)
HDL: 40 mg/dL (ref >39)
LDL Calculated: 78 mg/dL (ref 0–99)
Triglycerides: 126 mg/dL (ref 0–149)
VLDL Cholesterol Cal: 25 mg/dL (ref 5–40)

## 2019-06-16 LAB — BRAIN NATRIURETIC PEPTIDE: BNP: 4 pg/mL (ref 0.0–100.0)

## 2019-06-22 ENCOUNTER — Telehealth: Payer: Self-pay

## 2019-06-22 NOTE — Telephone Encounter (Signed)
-----   Message from Charlott Rakes, MD sent at 06/17/2019  9:52 AM EDT ----- A1c is controlled at 7.5, cholesterol is normal and other labs are stable.  BNP is normal which indicates he is not in fluid overload and weight is not cardiac related.  Please advised to work on weight loss and exercise.

## 2019-06-22 NOTE — Telephone Encounter (Signed)
Patient was called and informed of lab results. 

## 2019-08-16 ENCOUNTER — Ambulatory Visit: Payer: BLUE CROSS/BLUE SHIELD | Admitting: Family Medicine

## 2019-08-23 ENCOUNTER — Other Ambulatory Visit: Payer: Self-pay

## 2019-08-23 ENCOUNTER — Encounter: Payer: Self-pay | Admitting: Family Medicine

## 2019-08-23 ENCOUNTER — Ambulatory Visit: Payer: BLUE CROSS/BLUE SHIELD | Attending: Family Medicine | Admitting: Family Medicine

## 2019-08-23 DIAGNOSIS — E669 Obesity, unspecified: Secondary | ICD-10-CM | POA: Diagnosis not present

## 2019-08-23 DIAGNOSIS — I10 Essential (primary) hypertension: Secondary | ICD-10-CM

## 2019-08-23 DIAGNOSIS — I428 Other cardiomyopathies: Secondary | ICD-10-CM

## 2019-08-23 DIAGNOSIS — E66811 Obesity, class 1: Secondary | ICD-10-CM

## 2019-08-23 DIAGNOSIS — E1159 Type 2 diabetes mellitus with other circulatory complications: Secondary | ICD-10-CM | POA: Diagnosis not present

## 2019-08-23 MED ORDER — SPIRONOLACTONE 25 MG PO TABS: 25.0000 mg | ORAL_TABLET | Freq: Every day | ORAL | 1 refills | Status: DC

## 2019-08-23 MED ORDER — POTASSIUM CHLORIDE CRYS ER 20 MEQ PO TBCR: 40.0000 meq | EXTENDED_RELEASE_TABLET | Freq: Every day | ORAL | 1 refills | Status: DC

## 2019-08-23 MED ORDER — FUROSEMIDE 80 MG PO TABS: 80.0000 mg | ORAL_TABLET | Freq: Every day | ORAL | 1 refills | Status: DC

## 2019-08-23 MED ORDER — HYDRALAZINE HCL 50 MG PO TABS: 50.0000 mg | ORAL_TABLET | Freq: Two times a day (BID) | ORAL | 1 refills | Status: DC

## 2019-08-23 MED ORDER — CARVEDILOL 12.5 MG PO TABS: 12.5000 mg | ORAL_TABLET | Freq: Two times a day (BID) | ORAL | 1 refills | Status: DC

## 2019-08-23 MED ORDER — ATORVASTATIN CALCIUM 20 MG PO TABS: 20.0000 mg | ORAL_TABLET | Freq: Every day | ORAL | 1 refills | Status: DC

## 2019-08-23 NOTE — Progress Notes (Signed)
Virtual Visit via Telephone Note  I connected with [name], on 08/23/2019 at 9:21 AM by telephone due to the COVID-19 pandemic and verified that I am speaking with the correct person using two identifiers.   Consent: I discussed the limitations, risks, security and privacy concerns of performing an evaluation and management service by telephone and the availability of in person appointments. I also discussed with the patient that there may be a patient responsible charge related to this service. The patient expressed understanding and agreed to proceed.   Location of Patient: Work  Biomedical scientist of Provider: Clinic   Persons participating in Telemedicine visit: Kewan Mcnease [place]-CMA Dr. Felecia Shelling     History of Present Illness: Jim Garcia is a 53 year old male with a history of hypertension, nonischemic cardiomyopathy (EF 35 to 40% in 11/2018 which has improved from 25 to 30% in 05/2018), Type II DM (A1c 6.5) who presents today for a follow up visit. Fasting sugars have been less than 125. Denies hypoglycemia He is yet to have his annual eye exam even though I referred him to [facility] and also for a Colonoscopy however his chart reveals attempts at reaching the patient to schedule an appointment was futile.  He has been gaining weight and currently weight 276 which is up from 272 at his last visit. He eats late and does not eat healthy due to a busy schedule and does not exercise.  Denies orthopnea, dyspnea; sometimes has pedal edema which resolves when he elevates his feet. He has been unable to check his blood pressure as he does not have a monitor. Denies chest pains or other additional concerns.  Past Medical History:  Diagnosis Date  . CHF (congestive heart failure) ([place])   . Hypertension    No Known Allergies  Current Outpatient Medications on File Prior to Visit  Medication Sig Dispense Refill  . atorvastatin (LIPITOR) 20 MG tablet Take 1  tablet (20 mg total) by mouth daily. 90 tablet 1  . Blood Glucose Monitoring Suppl (TRUE METRIX METER) w/Device KIT 1 Device by Does not apply route 3 (three) times daily. 1 kit 0  . carvedilol (COREG) 12.5 MG tablet Take 1 tablet (12.5 mg total) by mouth 2 (two) times daily with a meal. 180 tablet 1  . furosemide (LASIX) 80 MG tablet Take 1 tablet (80 mg total) by mouth daily. 90 tablet 1  . glucose blood (TRUE METRIX BLOOD GLUCOSE TEST) test strip Use as instructed 100 each 12  . hydrALAZINE (APRESOLINE) 50 MG tablet Take 1 tablet (50 mg total) by mouth 2 (two) times daily. 180 tablet 1  . potassium chloride SA (K-DUR) 20 MEQ tablet Take 2 tablets (40 mEq total) by mouth daily. 180 tablet 1  . sacubitril-valsartan (ENTRESTO) 49-51 MG Take 1 tablet by mouth 2 (two) times daily. 180 tablet 3  . spironolactone (ALDACTONE) 25 MG tablet Take 1 tablet (25 mg total) by mouth daily. 90 tablet 1  . TRUEPLUS SAFETY LANCETS 28G MISC 1 Stick by Does not apply route 3 (three) times daily. 100 each 3   No current facility-administered medications on file prior to visit.     Observations/Objective: Awake, alert, oriented x3 Not in acute distress  Assessment and Plan: 1. Non-ischemic cardiomyopathy (HCC) EF of 35 to 40% No evidence of fluid overload Risk factor modification He has been educated on compliance with a heart healthy diet.  - atorvastatin (LIPITOR) 20 MG tablet; Take 1 tablet (20 mg total) by mouth  daily.  Dispense: 90 tablet; Refill: 1 - furosemide (LASIX) 80 MG tablet; Take 1 tablet (80 mg total) by mouth daily.  Dispense: 90 tablet; Refill: 1 - hydrALAZINE (APRESOLINE) 50 MG tablet; Take 1 tablet (50 mg total) by mouth 2 (two) times daily.  Dispense: 180 tablet; Refill: 1 - spironolactone (ALDACTONE) 25 MG tablet; Take 1 tablet (25 mg total) by mouth daily.  Dispense: 90 tablet; Refill: 1  2. Essential hypertension Unable to assess control at this time Encouraged to check blood  pressures at home Next visit will need to be an in person visit to assess his vitals - carvedilol (COREG) 12.5 MG tablet; Take 1 tablet (12.5 mg total) by mouth 2 (two) times daily with a meal.  Dispense: 180 tablet; Refill: 1  3. Type 2 diabetes mellitus with other circulatory complication, without long-term current use of insulin (HCC) Controlled with A1c of 6.5 Remains on dietary control Encouraged to call [facility] to schedule his eye exam Counseled on Diabetic diet, my plate method, 749 minutes of moderate intensity exercise/week Blood sugar logs with fasting goals of 80-120 mg/dl, random of less than 180 and in the event of sugars less than 60 mg/dl or greater than 400 mg/dl encouraged to notify the clinic. Advised on the need for annual eye exams, annual foot exams, Pneumonia vaccine.   4. Obesity (BMI 30.0-34.9) He continues to gain weight due to poor eating pattern and lack of exercise Counseled on reducing caloric intake, increasing physical activity and he promises to do better   Follow Up Instructions: 3 months for follow-up of chronic medical conditions   I discussed the assessment and treatment plan with the patient. The patient was provided an opportunity to ask questions and all were answered. The patient agreed with the plan and demonstrated an understanding of the instructions.   The patient was advised to call back or seek an in-person evaluation if the symptoms worsen or if the condition fails to improve as anticipated.     I provided 25 minutes total of non-face-to-face time during this encounter including median intraservice time, reviewing previous notes, labs, imaging, medications, management and patient verbalized understanding.     Charlott Rakes, MD, FAAFP. [facility] [place], [place]   08/23/2019, 9:21 AM

## 2019-08-23 NOTE — Progress Notes (Signed)
Patient has been called and DOB has been verified. Patient has been screened and transferred to PCP to start phone visit.   Patient states that his right ear is clogged.

## 2019-12-26 ENCOUNTER — Other Ambulatory Visit: Payer: Self-pay | Admitting: Family Medicine

## 2019-12-26 DIAGNOSIS — I428 Other cardiomyopathies: Secondary | ICD-10-CM

## 2020-02-07 ENCOUNTER — Ambulatory Visit: Payer: BLUE CROSS/BLUE SHIELD | Admitting: Cardiology

## 2020-03-25 ENCOUNTER — Other Ambulatory Visit: Payer: Self-pay | Admitting: Family Medicine

## 2020-03-25 DIAGNOSIS — I428 Other cardiomyopathies: Secondary | ICD-10-CM

## 2020-05-01 IMAGING — CR DG CHEST 2V
2 series · 2 of 2 positions shown · non-contrast
Comparison: Chest x-ray dated November 05, 2017.

CLINICAL DATA: Shortness of breath and bilateral leg swelling for
the past 2 weeks.

EXAM:
CHEST - 2 VIEW

[w chest pa]
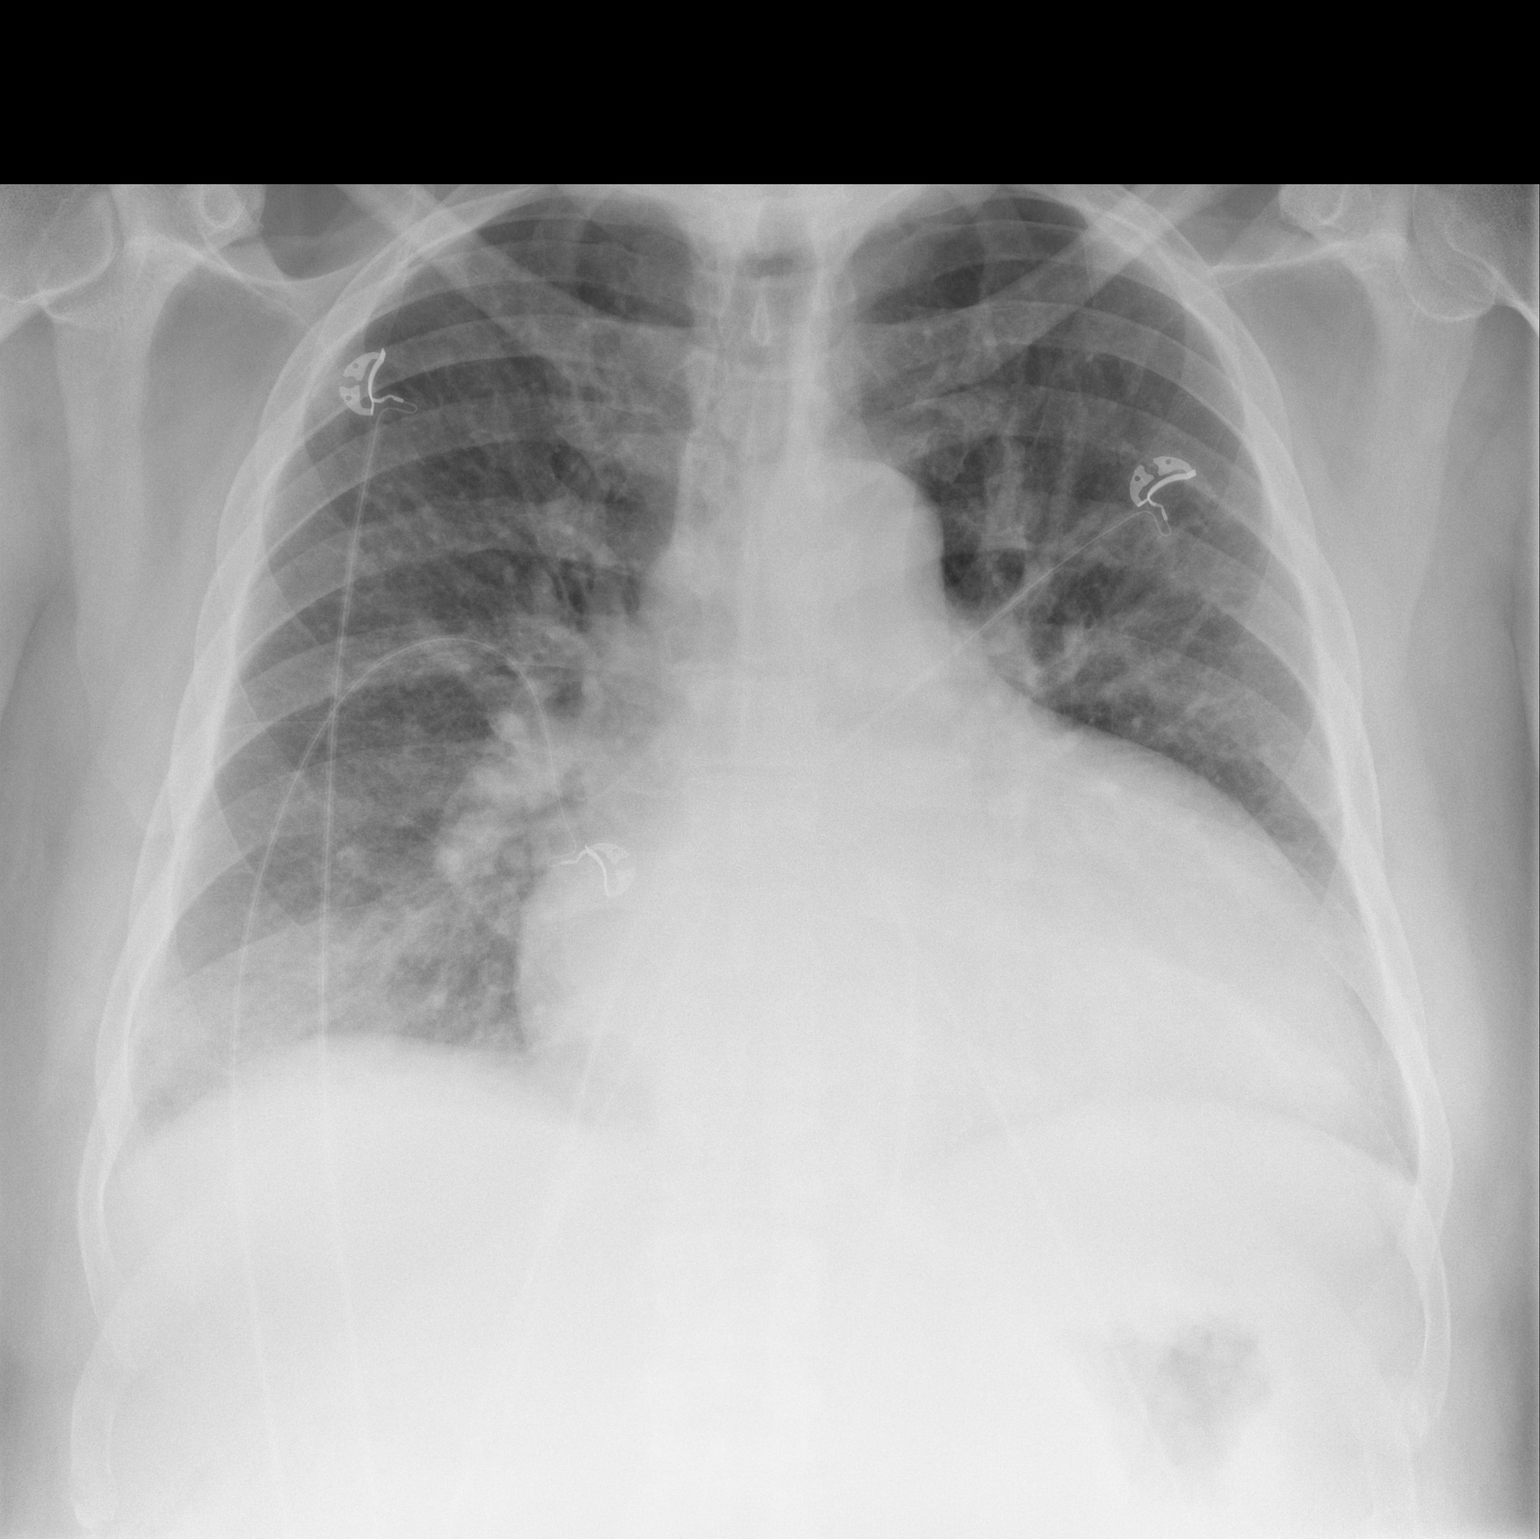

[w chest lat]
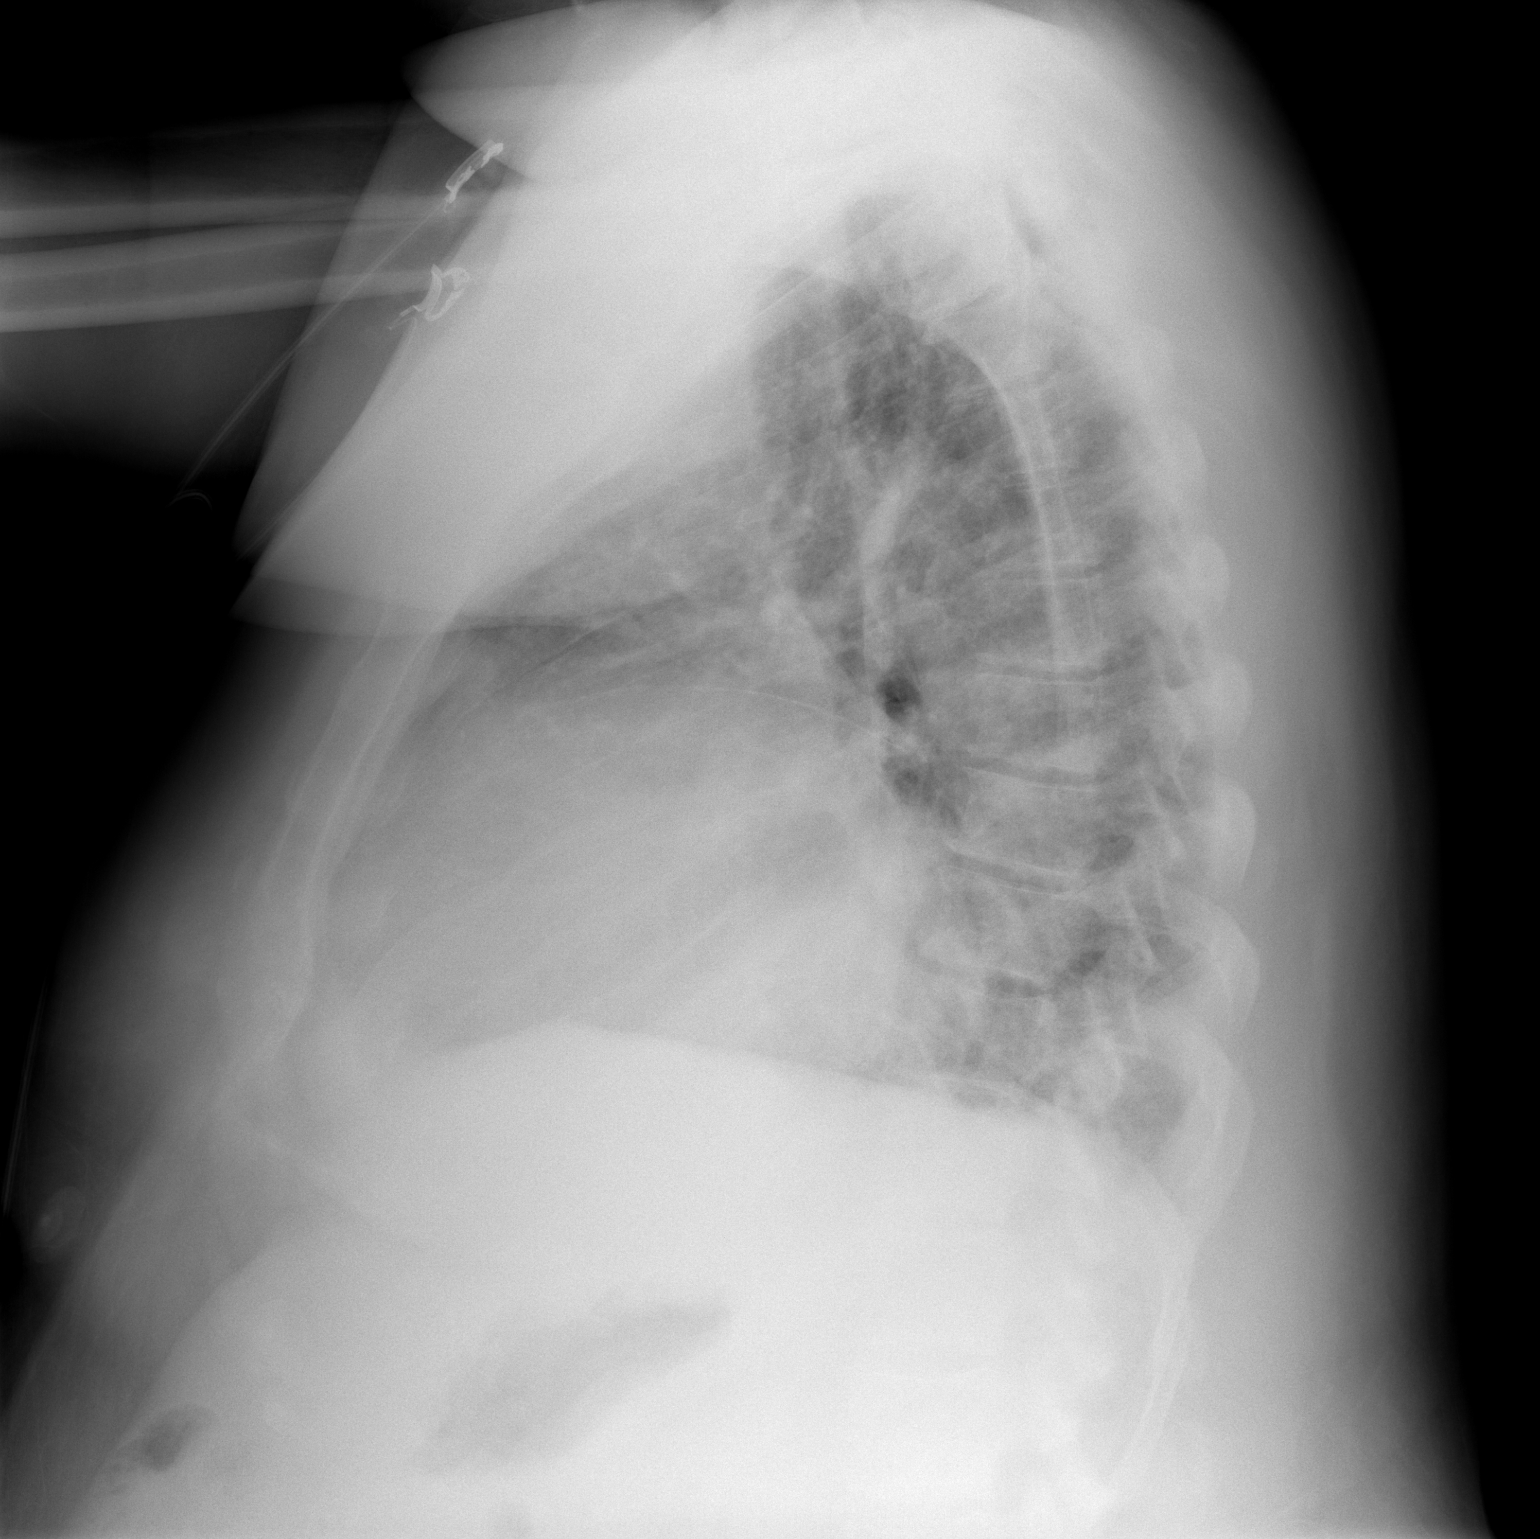

[2 of 2 positions shown; findings below may reference images not displayed]

FINDINGS: Stable cardiomegaly. Pulmonary vascular congestion. New hazy opacity
in the right lower lobe. No pleural effusion or pneumothorax. No
acute osseous abnormality.
IMPRESSION: 1. New hazy opacity in the right lower lobe may reflect asymmetric
edema or pneumonia.
2. Stable cardiomegaly and pulmonary vascular congestion.

## 2020-10-05 ENCOUNTER — Other Ambulatory Visit: Payer: Self-pay | Admitting: Family Medicine

## 2020-10-05 DIAGNOSIS — I428 Other cardiomyopathies: Secondary | ICD-10-CM

## 2020-10-05 NOTE — Telephone Encounter (Signed)
Requested medication (s) are due for refill today: expired medication and overdue office visit   Requested medication (s) are on the active medication list: yes  Last refill:  lipitor 08/23/2019 #90 1 refill,  lasix 12/26/19 #90 0 refills   Future visit scheduled: no  Notes to clinic:  expired medication, lipitor, last labs 06/15/2019. Overdue OV . Called patient to schedule appt. No answer, unable to leave message, mailbox full. Do you want to renew and refill Rx?     Requested Prescriptions  Pending Prescriptions Disp Refills   atorvastatin (LIPITOR) 20 MG tablet [Pharmacy Med Name: ATORVASTATIN 20MG  TABLETS] 90 tablet 1    Sig: TAKE 1 TABLET(20 MG) BY MOUTH DAILY      Cardiovascular:  Antilipid - Statins Failed - 10/05/2020 12:58 PM      Failed - Total Cholesterol in normal range and within 360 days    Cholesterol, Total  Date Value Ref Range Status  06/15/2019 143 100 - 199 mg/dL Final          Failed - LDL in normal range and within 360 days    LDL Calculated  Date Value Ref Range Status  06/15/2019 78 0 - 99 mg/dL Final          Failed - HDL in normal range and within 360 days    HDL  Date Value Ref Range Status  06/15/2019 40 >39 mg/dL Final          Failed - Triglycerides in normal range and within 360 days    Triglycerides  Date Value Ref Range Status  06/15/2019 126 0 - 149 mg/dL Final          Failed - Valid encounter within last 12 months    Recent Outpatient Visits           1 year ago Type 2 diabetes mellitus with other circulatory complication, without long-term current use of insulin (HCC)   [facility] [place], [place], [place]   1 year ago Screening for colon cancer   [facility] [place], [place], [place]   1 year ago Essential hypertension   [facility] [place], [place], [place]   1 year ago Type 2 diabetes mellitus without complication, without long-term current use of  insulin (HCC)   [facility] [place], [place], [place]   2 years ago Type 2 diabetes mellitus without complication, without long-term current use of insulin (HCC)    [facility] [place], [place], [place]              Passed - Patient is not pregnant        furosemide (LASIX) 80 MG tablet [Pharmacy Med Name: FUROSEMIDE 80MG  TABLETS] 90 tablet 0    Sig: TAKE 1 TABLET(80 MG) BY MOUTH DAILY      Cardiovascular:  Diuretics - Loop Failed - 10/05/2020 12:58 PM      Failed - K in normal range and within 360 days    Potassium  Date Value Ref Range Status  06/15/2019 4.5 3.5 - 5.2 mmol/L Final          Failed - Ca in normal range and within 360 days    Calcium  Date Value Ref Range Status  06/15/2019 9.5 8.7 - 10.2 mg/dL Final          Failed - Na in normal range and within 360 days    Sodium  Date  Value Ref Range Status  06/15/2019 140 134 - 144 mmol/L Final          Failed - Cr in normal range and within 360 days    Creatinine, Ser  Date Value Ref Range Status  06/15/2019 1.29 (H) 0.76 - 1.27 mg/dL Final          Failed - Last BP in normal range    BP Readings from Last 1 Encounters:  11/24/18 (!) 170/88          Failed - Valid encounter within last 6 months    Recent Outpatient Visits           1 year ago Type 2 diabetes mellitus with other circulatory complication, without long-term current use of insulin (HCC)   [facility] [place], Odette Horns, MD   1 year ago Screening for colon cancer   [facility] [place], Odette Horns, MD   1 year ago Essential hypertension   [facility] [place], Odette Horns, MD   1 year ago Type 2 diabetes mellitus without complication, without long-term current use of insulin (HCC)   [facility] [place], Odette Horns, MD   2 years ago Type 2 diabetes mellitus without complication, without  long-term current use of insulin (HCC)   [facility] [place], MD

## 2020-12-20 ENCOUNTER — Other Ambulatory Visit: Payer: Self-pay | Admitting: Family Medicine

## 2020-12-20 DIAGNOSIS — I428 Other cardiomyopathies: Secondary | ICD-10-CM

## 2021-03-30 ENCOUNTER — Emergency Department (HOSPITAL_COMMUNITY)
Admission: EM | Admit: 2021-03-30 | Discharge: 2021-03-30 | Disposition: A | Discharge status: Home / Self Care | Payer: BLUE CROSS/BLUE SHIELD | Attending: Emergency Medicine | Admitting: Emergency Medicine

## 2021-03-30 ENCOUNTER — Emergency Department (HOSPITAL_COMMUNITY): Payer: BLUE CROSS/BLUE SHIELD

## 2021-03-30 DIAGNOSIS — I5043 Acute on chronic combined systolic (congestive) and diastolic (congestive) heart failure: Secondary | ICD-10-CM | POA: Insufficient documentation

## 2021-03-30 DIAGNOSIS — R55 Syncope and collapse: Secondary | ICD-10-CM | POA: Insufficient documentation

## 2021-03-30 DIAGNOSIS — I110 Hypertensive heart disease with heart failure: Secondary | ICD-10-CM | POA: Insufficient documentation

## 2021-03-30 DIAGNOSIS — Z79899 Other long term (current) drug therapy: Secondary | ICD-10-CM | POA: Insufficient documentation

## 2021-03-30 DIAGNOSIS — S91209A Unspecified open wound of unspecified toe(s) with damage to nail, initial encounter: Secondary | ICD-10-CM

## 2021-03-30 DIAGNOSIS — R404 Transient alteration of awareness: Secondary | ICD-10-CM

## 2021-03-30 DIAGNOSIS — S0081XA Abrasion of other part of head, initial encounter: Secondary | ICD-10-CM

## 2021-03-30 DIAGNOSIS — S0083XA Contusion of other part of head, initial encounter: Secondary | ICD-10-CM

## 2021-03-30 DIAGNOSIS — E119 Type 2 diabetes mellitus without complications: Secondary | ICD-10-CM | POA: Insufficient documentation

## 2021-03-30 DIAGNOSIS — R4189 Other symptoms and signs involving cognitive functions and awareness: Secondary | ICD-10-CM

## 2021-03-30 DIAGNOSIS — S91201A Unspecified open wound of right great toe with damage to nail, initial encounter: Secondary | ICD-10-CM | POA: Insufficient documentation

## 2021-03-30 DIAGNOSIS — W19XXXA Unspecified fall, initial encounter: Secondary | ICD-10-CM | POA: Insufficient documentation

## 2021-03-30 DIAGNOSIS — I10 Essential (primary) hypertension: Secondary | ICD-10-CM

## 2021-03-30 DIAGNOSIS — I428 Other cardiomyopathies: Secondary | ICD-10-CM

## 2021-03-30 LAB — I-STAT CHEM 8, ED
BUN: 10 mg/dL (ref 6–20)
Calcium, Ion: 1.1 mmol/L — ABNORMAL LOW (ref 1.15–1.40)
Chloride: 107 mmol/L (ref 98–111)
Creatinine, Ser: 1 mg/dL (ref 0.61–1.24)
Glucose, Bld: 117 mg/dL — ABNORMAL HIGH (ref 70–99)
HCT: 45 % (ref 39.0–52.0)
Hemoglobin: 15.3 g/dL (ref 13.0–17.0)
Potassium: 3.9 mmol/L (ref 3.5–5.1)
Sodium: 143 mmol/L (ref 135–145)
TCO2: 20 mmol/L — ABNORMAL LOW (ref 22–32)

## 2021-03-30 LAB — COMPREHENSIVE METABOLIC PANEL
ALT: 13 U/L (ref 0–44)
AST: 18 U/L (ref 15–41)
Albumin: 4 g/dL (ref 3.5–5.0)
Alkaline Phosphatase: 66 U/L (ref 38–126)
Anion gap: 13 (ref 5–15)
BUN: 10 mg/dL (ref 6–20)
CO2: 20 mmol/L — ABNORMAL LOW (ref 22–32)
Calcium: 9.3 mg/dL (ref 8.9–10.3)
Chloride: 109 mmol/L (ref 98–111)
Creatinine, Ser: 1.14 mg/dL (ref 0.61–1.24)
GFR, Estimated: >60 mL/min (ref >60)
Glucose, Bld: 122 mg/dL — ABNORMAL HIGH (ref 70–99)
Potassium: 4 mmol/L (ref 3.5–5.1)
Sodium: 142 mmol/L (ref 135–145)
Total Bilirubin: 0.6 mg/dL (ref 0.3–1.2)
Total Protein: 7.1 g/dL (ref 6.5–8.1)

## 2021-03-30 LAB — CBC
HCT: 46.4 % (ref 39.0–52.0)
Hemoglobin: 15.2 g/dL (ref 13.0–17.0)
MCH: 29.9 pg (ref 26.0–34.0)
MCHC: 32.8 g/dL (ref 30.0–36.0)
MCV: 91.2 fL (ref 80.0–100.0)
Platelets: 220 10*3/uL (ref 150–400)
RBC: 5.09 MIL/uL (ref 4.22–5.81)
RDW: 14 % (ref 11.5–15.5)
WBC: 8 10*3/uL (ref 4.0–10.5)
nRBC: 0 % (ref 0.0–0.2)

## 2021-03-30 LAB — PROTIME-INR
INR: 1 (ref 0.8–1.2)
Prothrombin Time: 12.9 seconds (ref 11.4–15.2)

## 2021-03-30 LAB — DIFFERENTIAL
Abs Immature Granulocytes: 0.04 10*3/uL (ref 0.00–0.07)
Basophils Absolute: 0 10*3/uL (ref 0.0–0.1)
Basophils Relative: 1 %
Eosinophils Absolute: 0 10*3/uL (ref 0.0–0.5)
Eosinophils Relative: 0 %
Immature Granulocytes: 1 %
Lymphocytes Relative: 24 %
Lymphs Abs: 1.9 10*3/uL (ref 0.7–4.0)
Monocytes Absolute: 0.5 10*3/uL (ref 0.1–1.0)
Monocytes Relative: 6 %
Neutro Abs: 5.5 10*3/uL (ref 1.7–7.7)
Neutrophils Relative %: 68 %

## 2021-03-30 LAB — CBG MONITORING, ED: Glucose-Capillary: 101 mg/dL — ABNORMAL HIGH (ref 70–99)

## 2021-03-30 LAB — APTT: aPTT: 23 seconds — ABNORMAL LOW (ref 24–36)

## 2021-03-30 MED ORDER — SODIUM CHLORIDE 0.9% FLUSH
3.0000 mL | Freq: Once | INTRAVENOUS | Status: DC
Start: 2021-03-30 — End: 2021-03-31

## 2021-03-30 MED ORDER — HYDRALAZINE HCL 20 MG/ML IJ SOLN
10.0000 mg | Freq: Once | INTRAMUSCULAR | Status: AC
  Administered 2021-03-30: 10 mg via INTRAVENOUS
  Filled 2021-03-30: qty 1

## 2021-03-30 MED ORDER — HYDRALAZINE HCL 50 MG PO TABS: 50.0000 mg | ORAL_TABLET | Freq: Two times a day (BID) | ORAL | 0 refills | Status: DC

## 2021-03-30 MED ORDER — SPIRONOLACTONE 25 MG PO TABS: 25.0000 mg | ORAL_TABLET | Freq: Every day | ORAL | 0 refills | Status: DC

## 2021-03-30 NOTE — ED Notes (Signed)
DC instrcutions reviewed with pt. PT verbalized understanding.  PT Dc.

## 2021-03-30 NOTE — Discharge Instructions (Signed)
You are seen in the emergency department for evaluation of a syncopal event in which she fell and struck your face.  He also ripped off your right great toenail.  This will probably grow back over time.  You should use soap and water for your injuries and ice.  Please follow-up with neurology regarding your fainting spell.  You should not drive until you are seen by neurology and cleared.

## 2021-03-30 NOTE — Consult Note (Addendum)
Neurology consult   CC: code stroke  History is obtained from: brother and cousin.   HPI: Jim Garcia is a 55 yo male with a PMHx of HTN, CHF, non ischemic cardiomyopathy, DM II, OSA and obesity who presented to the ED via EMS as a code stroke due to aphasia. Story given was that patient was working on his camper generator with family today. Patient does not recall event. Per family, patient was just standing there and fell face first (like a tree) onto the concrete. Family reported that after the fall with head trauma, patient began to shake for awhile and after awakening, he couldn't get his words out. En route, CBG was 106 and BP was 150/106. EMS states during exam on the bridge, that his behavior and exam are better than the scene and en route.   After brief exam on the ED bridge and airway clearance, patient was brought emergently to CT. Patient vomited x 1 on way to CT. Patient denies HA or any pain. No recent issues with his heart. No dizziness or reported blacking out before the spell.   Head CTH was negative except for large left frontal/periorbital soft tissue contusion.   tPA was not given due to the patient's resolved symptoms and low NIHSS. IR not considered due to resolved symptoms and exam not consistent with LVO.   Patient denies history of stroke or seizures. FMHx of seizures. Nothing like today's event has ever happened to him before. He admits to smoking marijuana this morning, but hours prior to event.   In review of chart, last out patient OV note was in 2020. No ED or hospital stays here [facility] 04/2018 for a CHF exacerbation.    LKW: 1415  hours tpa given?: No, low suspicion for stroke and NIHSS 0.   IR Thrombectomy?: No , no LVO suspected.  MRS 0  NIHSS:  1a Level of Consciousness: 0 1b LOC Questions: 0 1c LOC Commands: 0 2 Best Gaze: 0 3 Visual: 0 4 Facial Palsy: 0 5a Motor Arm - left: 0 5b Motor Arm - Right: 0 6a Motor Leg - Left: 0 6b Motor Leg - Right: 0 7  Limb Ataxia: 0 8 Sensory: 0 9 Best Language: 0 10 Dysarthria: 0 11 Extinction and Inattention: 0 TOTAL:  0  ROS: A robust ROS was performed and is negative except as noted in the HPI.   Past Medical History:  Diagnosis Date  . CHF (congestive heart failure) ([place])   . Hypertension   HLD Non ischemic cardiopathy Obesity OSA   Family History  Problem Relation Age of Onset  . Diabetes Mother   . Hypertension Mother   . Diabetes Father     Social History:  reports that he has quit smoking. He has never used smokeless tobacco. He reports current alcohol use. He reports current drug use. Drug: Marijuana.  Last use of Marijuana today 03/30/21.    Prior to Admission medications   Medication Sig Start Date End Date Taking? Authorizing Provider  atorvastatin (LIPITOR) 20 MG tablet Take 1 tablet (20 mg total) by mouth daily. 08/23/19   Charlott Rakes, MD  Blood Glucose Monitoring Suppl (TRUE METRIX METER) w/Device KIT 1 Device by Does not apply route 3 (three) times daily. 05/18/18   Charlott Rakes, MD  carvedilol (COREG) 12.5 MG tablet Take 1 tablet (12.5 mg total) by mouth 2 (two) times daily with a meal. 08/23/19   Newlin, Enobong, MD  ENTRESTO 49-51 MG TAKE 1 TABLET BY  MOUTH TWICE DAILY 03/27/20   Charlott Rakes, MD  furosemide (LASIX) 80 MG tablet TAKE 1 TABLET(80 MG) BY MOUTH DAILY 12/26/19   Charlott Rakes, MD  glucose blood (TRUE METRIX BLOOD GLUCOSE TEST) test strip Use as instructed 05/18/18   Charlott Rakes, MD  hydrALAZINE (APRESOLINE) 50 MG tablet Take 1 tablet (50 mg total) by mouth 2 (two) times daily. 08/23/19   Charlott Rakes, MD  potassium chloride SA (KLOR-CON) 20 MEQ tablet Take 2 tablets (40 mEq total) by mouth daily. 08/23/19   Charlott Rakes, MD  spironolactone (ALDACTONE) 25 MG tablet TAKE 1 TABLET(25 MG) BY MOUTH DAILY 03/27/20   Charlott Rakes, MD  TRUEPLUS SAFETY LANCETS 28G MISC 1 Stick by Does not apply route 3 (three) times daily. 05/18/18   Charlott Rakes, MD    Exam: Current vital signs: BP (!) 178/118 (BP Location: Right Arm)   Pulse 71   Temp 97.9 F (36.6 C) (Oral)   Resp 16   Ht _0  (1.905 m)   Wt 127.5 kg   SpO2 98%   BMI 35.13 kg/m   Physical Exam  Constitutional: Appears well-developed and obese.   Psych: Affect appropriate to situation. Only confused about event today.  Eyes: No scleral injection HENT: No OP obstrucion Head: Normocephalic.  Cardiovascular: Normal rate and regular rhythm.  Respiratory: Effort normal. GI: Soft.  No distension. There is no tenderness.  Skin: Hematoma noted to eye brow on left with periorbital swelling on the left. Right great toenail is missing with redness/blood at site. Abrasions to face.   Neuro: Mental Status: Patient is awake, alert, oriented to person, place, month, year, and situation. Patient is able to give a clear and coherent history except for the actual event. No signs of neglect. Speech/Language:  Speech is clear, fluent without dysarthria or aphasia. Repetition, naming, and comprehension intact.  Cranial Nerves: II: Visual Fields are full. Pupils are equal, round, and reactive to light.  III,IV, VI: EOMI without ptosis or diploplia.  V: Facial sensation is symmetric to light touch in V1, V2, and V3 VII: Facial movement is symmetric.  VIII: hearing is intact to voice. X: Uvula elevates symmetrically. XI: Shoulder shrug is symmetric. XII: tongue is midline without atrophy or fasciculations.  Motor: All muscle groups are 5/5 x 4 extremities.  Tone is normal. Bulk is increased.  Sensory: Sensation is symmetric to light touch in all fours extremities. Extinction absent to light touch DSS.  Plantars: Toes are downgoing bilaterally.  Cerebellar: No ataxia noted with FNF and HKS bilaterally. Finger roll without ataxia.   I have reviewed labs in epic and the pertinent results are: Na 143    INR 1.0    aPTT  23       MD reviewed the images obtained:  NCT  head  Without an acute abnormality except for soft tissue swelling over left eye.   Assessment: 55 yo male brought in as code stroke today after a fall, hitting his head on concrete, and then having expressive aphasia. Little concern for stroke now given his symptoms have resolved, his NIHSS is 0, and his fall is more consistent with syncope or seizure. First time seizure is in the differential due to him falling straight down and witnessed shaking and stiffness at the scene. Event is likely seizure vs. syncopal episode.   Impression:  -seizure vs syncope and collapse.   Plan: -Plan discussed with ED MD by Dr. Leonel Ramsay. -No need for MRI brain here at this  time.  -EEG as an outpatient unless patient exhibits seizure activity in the ED.  -Out patient f/up with neurology.  - Already on Aspirin 2m daily. - BP goal normotensive or per ED MD.   Patient seen by NP and MD. MD will review note and edit as needed.  KClance Boll MSN, APN-BC/Neurology  I have seen the patient and reviewed the above note.  The event described by family is concerning for seizure. Given that this is a first ever seizure, I would not start antiepileptic medications at this time, but he will need an EEG and either as an outpatient or inpatient.  Syncope with concussion would also be in the differential as well.  He will need to be told that he is not allowed to drive for period of 6 months from the most recent loss of consciousness.  MRoland Rack MD [facility] [phone] If 7pm- 7am, please page neurology on call as listed in [place]

## 2021-03-30 NOTE — ED Triage Notes (Signed)
Pt BIB [facility] for syncopal episode with LOC.  PT was working on a generator when his brother saw him stand up, stare for a second and then pass out and fall over like a tree. Pt hit head on concrete when he fell and woke up with intermittent expressive aphasia.   Pt has a hematoma above left eye and is missing his toe nail on his right great toe.

## 2021-03-30 NOTE — ED Provider Notes (Signed)
[facility] EMERGENCY DEPARTMENT Provider Note   CSN: [id number] Arrival date & time: 03/30/21  1510     History No chief complaint on file.   Jim Garcia is a 55 y.o. male.  He is brought in by EMS as a code stroke activation.  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  He fell forward striking his head.  When he came to he was altered and had some garbled speech.  He is not having any difficulty following commands with his arms or legs.  Patient denies any complaints.  No chest pain or shortness of breath.  The history is provided by the patient and the EMS personnel.  Loss of Consciousness Episode history:  Single Most recent episode:  Today Progression:  Resolved Chronicity:  New Context: normal activity   Witnessed: yes   Relieved by:  Lying down Worsened by:  Nothing Ineffective treatments:  None tried Associated symptoms: no chest pain, no difficulty breathing, no fever and no shortness of breath   Associated symptoms comment:  Aphasia      Past Medical History:  Diagnosis Date  . CHF (congestive heart failure) ([place])   . Hypertension     Patient Active Problem List   Diagnosis Date Noted  . Obesity (BMI 30.0-34.9) 11/24/2018  . Sleep apnea, obstructive 04/15/2018  . Non-ischemic cardiomyopathy ([place]) 04/15/2018  . Essential hypertension 04/15/2018  . Hypokalemia   . Acute on chronic combined systolic and diastolic CHF, NYHA class 3 ([place]) 04/14/2018  . Type 2 diabetes mellitus without complication ([place]) 32/67/1245  . Morbid obesity due to excess calories ([place]) 06/13/2015  . CHF (congestive heart failure) ([place]) 06/12/2015  . Edema 06/12/2015  . Hyperlipidemia 06/12/2015    No past surgical history on file.     Family History  Problem Relation Age of Onset  . Diabetes Mother   . Hypertension Mother   . Diabetes Father     Social History   Tobacco Use   . Smoking status: Former Research scientist (life sciences)  . Smokeless tobacco: Never Used  Vaping Use  . Vaping Use: Never used  Substance Use Topics  . Alcohol use: Yes    Comment: occ  . Drug use: Yes    Types: Marijuana    Home Medications Prior to Admission medications   Medication Sig Start Date End Date Taking? Authorizing Provider  atorvastatin (LIPITOR) 20 MG tablet Take 1 tablet (20 mg total) by mouth daily. 08/23/19   Charlott Rakes, MD  Blood Glucose Monitoring Suppl (TRUE METRIX METER) w/Device KIT 1 Device by Does not apply route 3 (three) times daily. 05/18/18   Charlott Rakes, MD  carvedilol (COREG) 12.5 MG tablet Take 1 tablet (12.5 mg total) by mouth 2 (two) times daily with a meal. 08/23/19   Newlin, Enobong, MD  ENTRESTO 49-51 MG TAKE 1 TABLET BY MOUTH TWICE DAILY 03/27/20   Charlott Rakes, MD  furosemide (LASIX) 80 MG tablet TAKE 1 TABLET(80 MG) BY MOUTH DAILY 12/26/19   Charlott Rakes, MD  glucose blood (TRUE METRIX BLOOD GLUCOSE TEST) test strip Use as instructed 05/18/18   Charlott Rakes, MD  hydrALAZINE (APRESOLINE) 50 MG tablet Take 1 tablet (50 mg total) by mouth 2 (two) times daily. 08/23/19   Charlott Rakes, MD  potassium chloride SA (KLOR-CON) 20 MEQ tablet Take 2 tablets (40 mEq total) by mouth daily. 08/23/19   Charlott Rakes, MD  spironolactone (ALDACTONE) 25 MG tablet TAKE 1 TABLET(25 MG) BY MOUTH DAILY 03/27/20   Charlott Rakes, MD  TRUEPLUS SAFETY LANCETS 28G MISC 1 Stick by Does not apply route 3 (three) times daily. 05/18/18   Charlott Rakes, MD    Allergies    Patient has no known allergies.  Review of Systems   Review of Systems  Constitutional: Negative for fever.  HENT: Negative for sore throat.   Eyes: Negative for visual disturbance.  Respiratory: Negative for shortness of breath.   Cardiovascular: Positive for syncope. Negative for chest pain.  Gastrointestinal: Negative for abdominal pain.  Genitourinary: Negative for dysuria.  Musculoskeletal: Negative  for neck pain.  Skin: Positive for wound. Negative for rash.  Neurological: Positive for speech difficulty.    Physical Exam Updated Vital Signs BP (!) 179/99   Pulse 85   Temp 97.9 F (36.6 C) (Oral)   Resp 20   Ht 6' 3"  (1.905 m)   Wt 127.5 kg   SpO2 98%   BMI 35.13 kg/m   Physical Exam Vitals and nursing note reviewed.  Constitutional:      Appearance: Normal appearance. He is well-developed.  HENT:     Head: Normocephalic.     Comments: He is a hematoma above his left orbit and abrasion there. Eyes:     Extraocular Movements: Extraocular movements intact.     Conjunctiva/sclera: Conjunctivae normal.     Pupils: Pupils are equal, round, and reactive to light.  Cardiovascular:     Rate and Rhythm: Normal rate and regular rhythm.     Heart sounds: No murmur heard.   Pulmonary:     Effort: Pulmonary effort is normal. No respiratory distress.     Breath sounds: Normal breath sounds.  Abdominal:     Palpations: Abdomen is soft.     Tenderness: There is no abdominal tenderness. There is no guarding or rebound.  Musculoskeletal:        General: No deformity or signs of injury. Normal range of motion.     Cervical back: Neck supple.  Skin:    General: Skin is warm and dry.  Neurological:     General: No focal deficit present.     Mental Status: He is alert.     Cranial Nerves: No cranial nerve deficit.     Sensory: No sensory deficit.     Motor: No weakness.     ED Results / Procedures / Treatments   Labs (all labs ordered are listed, but only abnormal results are displayed) Labs Reviewed  APTT - Abnormal; Notable for the following components:      Result Value   aPTT 23 (*)    All other components within normal limits  COMPREHENSIVE METABOLIC PANEL - Abnormal; Notable for the following components:   CO2 20 (*)    Glucose, Bld 122 (*)    All other components within normal limits  I-STAT CHEM 8, ED - Abnormal; Notable for the following components:    Glucose, Bld 117 (*)    Calcium, Ion 1.10 (*)    TCO2 20 (*)    All other components within normal limits  CBG MONITORING, ED - Abnormal; Notable for the following components:   Glucose-Capillary 101 (*)    All other components within normal limits  PROTIME-INR  CBC  DIFFERENTIAL    EKG EKG Interpretation  Date/Time:  Saturday Mar 30 2021 16:22:34 EDT Ventricular Rate:  76 PR Interval:  195 QRS Duration: 128 QT Interval:  409  QTC Calculation: 460 R Axis:   -71 Text Interpretation: Sinus rhythm Left bundle branch block No significant change since prior 6/19 Confirmed by Aletta Edouard [phone]) on 03/30/2021 5:28:52 PM   Radiology CT HEAD CODE STROKE WO CONTRAST  Result Date: 03/30/2021 CLINICAL DATA:  Code stroke.  Neuro deficit, acute stroke suspected. EXAM: CT HEAD WITHOUT CONTRAST TECHNIQUE: Contiguous axial images were obtained from the base of the skull through the vertex without intravenous contrast. COMPARISON:  None. FINDINGS: Brain: No evidence of acute large vascular territory infarction, hemorrhage, hydrocephalus, extra-axial collection or mass lesion/mass effect. 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 Mild patchy white matter hypoattenuation, most likely related to chronic microvascular ischemic disease. Vascular: No hyperdense vessel identified. Mild calcific atherosclerosis. Skull: Large left frontal/periorbital soft tissue contusion without acute fracture. Sinuses/Orbits: Clear sinuses. Partially imaged multiple maxillary periapical lucencies. Left periorbital contusion. Otherwise, unremarkable orbits. Other: No mastoid effusions. ASPECTS [facility] Stroke Program Early CT Score) total score (0-10 with 10 being normal): 10. IMPRESSION: 1. No evidence of acute intracranial abnormality.  ASPECTS is 10. 2. Large left frontal/periorbital soft tissue contusion without acute fracture. 3. Mild suspected chronic  microvascular ischemic disease. Code stroke imaging results were communicated on 03/30/2021 at 3:33 pm to provider Dr. Leonel Ramsay via secure text paging. Electronically Signed   By: Margaretha Sheffield MD   On: 03/30/2021 15:34    Procedures .Critical Care Performed by: Hayden Rasmussen, MD Authorized by: Hayden Rasmussen, MD   Critical care provider statement:    Critical care time (minutes):  45   Critical care time was exclusive of:  Separately billable procedures and treating other patients   Critical care was necessary to treat or prevent imminent or life-threatening deterioration of the following conditions:  CNS failure or compromise   Critical care was time spent personally by me on the following activities:  Discussions with consultants, evaluation of patient's response to treatment, examination of patient, ordering and performing treatments and interventions, ordering and review of laboratory studies, ordering and review of radiographic studies, pulse oximetry, re-evaluation of patient's condition, obtaining history from patient or surrogate, review of old charts and development of treatment plan with patient or surrogate     Medications Ordered in ED Medications  sodium chloride flush (NS) 0.9 % injection 3 mL (has no administration in time range)  hydrALAZINE (APRESOLINE) injection 10 mg (10 mg Intravenous Given 03/30/21 1722)    ED Course  I have reviewed the triage vital signs and the nursing notes.  Pertinent labs & imaging results that were available during my care of the patient were reviewed by me and considered in my medical decision making (see chart for details).  Clinical Course as of 03/30/21 2103  Sat Mar 30, 2021  1537 Dr. Saralyn Pilar feels his presentation is more consistent with seizure versus syncope.  Head CT does not show any acute findings.  Waiting on the rest of the labs to come back.  He recommends driving probations and outpatient follow-up with neurology.  [MB]  8119 Patient's blood pressure is running high here.  He says he has not taken his hydralazine or Coreg or spironolactone for a little while.  He needs new prescriptions for those.  He actually said do not give him prescription for the Coreg because it cost too much so he is not can fill that anyways. [MB]    Clinical Course User Index [MB] Hayden Rasmussen, MD   MDM  Rules/Calculators/A&P                         This patient complains of unresponsive episode followed by some garbled speech and confusion; this involves an extensive number of treatment Options and is a complaint that carries with it a high risk of complications and Morbidity. The differential includes stroke, syncope, seizure, head bleed, hypertensive emergency  I ordered, reviewed and interpreted labs, which included CBC with normal white count normal hemoglobin, chemistries fairly normal than low bicarb, INR normal, glucose normal I ordered medication IV hydralazine for patient's accelerated blood pressure, it sounds like he has been off of his blood pressure medicine for a while I ordered imaging studies which included CT head and I independently    visualized and interpreted imaging which showed no acute bleed.  Does have soft tissue hematoma. Additional history obtained from EMS Previous records obtained and reviewed in epic, no recent admissions I consulted neuro hospitalist Dr. Leonel Ramsay and discussed lab and imaging findings  Critical Interventions: Rapid evaluation and work-up of patient's neurologic symptoms requiring bedside presence during initial stroke evaluation.  After the interventions stated above, I reevaluated the patient and found patient symptoms to be improved.  Neurology is recommending discharge and outpatient follow-up.  I have reordered some of his blood pressure medicine that he is out of.  Recommended close follow-up with PCP.  Neurology referral.  Counseled to not drive until cleared by  neurology.   Final Clinical Impression(s) / ED Diagnoses Final diagnoses:  Abrasion of face, initial encounter  Contusion of forehead, initial encounter  Avulsion of toenail of right foot  Unresponsive episode  Primary hypertension    Rx / DC Orders ED Discharge Orders         Ordered    Ambulatory referral to Neurology       Comments: An appointment is requested in approximately: 2 weeks   03/30/21 1715    hydrALAZINE (APRESOLINE) 50 MG tablet  2 times daily        03/30/21 1715    spironolactone (ALDACTONE) 25 MG tablet  Daily        03/30/21 1715           Hayden Rasmussen, MD 03/30/21 2106

## 2021-03-31 ENCOUNTER — Other Ambulatory Visit: Payer: Self-pay | Admitting: Family Medicine

## 2021-03-31 DIAGNOSIS — I428 Other cardiomyopathies: Secondary | ICD-10-CM

## 2021-03-31 NOTE — Telephone Encounter (Signed)
Requested medication (s) are due for refill today: yes  Requested medication (s) are on the active medication list: yes  Last refill:  03/27/20  Future visit scheduled: no  Notes to clinic:  Pt stated he fainted yesterday and needed to see neurologist first but stated he will call this week for appt. Medication not assigned to a protocol.   Requested Prescriptions  Pending Prescriptions Disp Refills   ENTRESTO 49-51 MG [Pharmacy Med Name: ENTRESTO 49-51MG  TABLETS] 180 tablet 0    Sig: TAKE 1 TABLET BY MOUTH TWICE DAILY      Off-Protocol Failed - 03/31/2021  2:23 PM      Failed - Medication not assigned to a protocol, review manually.      Failed - Valid encounter within last 12 months    Recent Outpatient Visits           1 year ago Type 2 diabetes mellitus with other circulatory complication, without long-term current use of insulin (HCC)   [facility] [place], Odette Horns, MD   1 year ago Screening for colon cancer   [facility] [place], Odette Horns, MD   2 years ago Essential hypertension   [facility] [place], Odette Horns, MD   2 years ago Type 2 diabetes mellitus without complication, without long-term current use of insulin (HCC)   [facility] [place], Odette Horns, MD   2 years ago Type 2 diabetes mellitus without complication, without long-term current use of insulin (HCC)    [facility] Hoy Register, MD

## 2021-04-07 ENCOUNTER — Other Ambulatory Visit: Payer: Self-pay | Admitting: Family Medicine

## 2021-04-07 DIAGNOSIS — I428 Other cardiomyopathies: Secondary | ICD-10-CM

## 2021-04-07 NOTE — Telephone Encounter (Signed)
Requested medication (s) are due for refill today: yes to both  Requested medication (s) are on the active medication list: yes  Last refill:  atorvastatin: 08/22/20            Entresto: 03/27/20  Future visit scheduled: no  Notes to clinic:  called pt but unable to LM, voice mail full. Entresto: med not assigned to protocol   Requested Prescriptions  Pending Prescriptions Disp Refills   atorvastatin (LIPITOR) 20 MG tablet [Pharmacy Med Name: ATORVASTATIN 20MG  TABLETS] 90 tablet 1    Sig: TAKE 1 TABLET(20 MG) BY MOUTH DAILY      Cardiovascular:  Antilipid - Statins Failed - 04/07/2021 12:33 PM      Failed - Total Cholesterol in normal range and within 360 days    Cholesterol, Total  Date Value Ref Range Status  06/15/2019 143 100 - 199 mg/dL Final          Failed - LDL in normal range and within 360 days    LDL Calculated  Date Value Ref Range Status  06/15/2019 78 0 - 99 mg/dL Final          Failed - HDL in normal range and within 360 days    HDL  Date Value Ref Range Status  06/15/2019 40 >39 mg/dL Final          Failed - Triglycerides in normal range and within 360 days    Triglycerides  Date Value Ref Range Status  06/15/2019 126 0 - 149 mg/dL Final          Failed - Valid encounter within last 12 months    Recent Outpatient Visits           1 year ago Type 2 diabetes mellitus with other circulatory complication, without long-term current use of insulin (HCC)   [facility] [place], [place], MD   1 year ago Screening for colon cancer   [facility] [place], [place], MD   2 years ago Essential hypertension   [facility] [place], [place], MD   2 years ago Type 2 diabetes mellitus without complication, without long-term current use of insulin (HCC)   [place]  [facility] [place], [place], MD   2 years ago Type 2 diabetes mellitus without complication,  without long-term current use of insulin (HCC)   [facility] [place], MD                Passed - Patient is not pregnant        ENTRESTO 49-51 MG [Pharmacy Med Name: ENTRESTO 49-51MG  TABLETS] 180 tablet 0    Sig: TAKE 1 TABLET BY MOUTH TWICE DAILY      Off-Protocol Failed - 04/07/2021 12:33 PM      Failed - Medication not assigned to a protocol, review manually.      Failed - Valid encounter within last 12 months    Recent Outpatient Visits           1 year ago Type 2 diabetes mellitus with other circulatory complication, without long-term current use of insulin (HCC)   [facility] 06/07/2021, MD   1 year ago Screening for colon cancer   [place] [facility] Hoy Register, MD   2 years ago Essential hypertension    [facility] H[facility] Register, MD   2 years ago Type  2 diabetes mellitus without complication, without long-term current use of insulin (HCC)   [place] [facility] [place], [place], MD   2 years ago Type 2 diabetes mellitus without complication, without long-term current use of insulin [facility])    [facility] Hoy Register, MD

## 2021-04-08 ENCOUNTER — Telehealth: Payer: Self-pay | Admitting: Diagnostic Neuroimaging

## 2021-04-08 ENCOUNTER — Ambulatory Visit (INDEPENDENT_AMBULATORY_CARE_PROVIDER_SITE_OTHER): Payer: 59 | Admitting: Diagnostic Neuroimaging

## 2021-04-08 ENCOUNTER — Other Ambulatory Visit: Payer: Self-pay | Admitting: Family Medicine

## 2021-04-08 ENCOUNTER — Encounter: Payer: Self-pay | Admitting: Diagnostic Neuroimaging

## 2021-04-08 VITALS — BP 164/99 | Ht 75.0 in | Wt 278.0 lb

## 2021-04-08 DIAGNOSIS — R55 Syncope and collapse: Secondary | ICD-10-CM | POA: Diagnosis not present

## 2021-04-08 DIAGNOSIS — I428 Other cardiomyopathies: Secondary | ICD-10-CM

## 2021-04-08 NOTE — Progress Notes (Signed)
[facility]  PATIENT: Jim Garcia DOB: [birth year]  REFERRING CLINICIAN: Hayden Rasmussen, MD HISTORY FROM: patient  REASON FOR VISIT: new consult    HISTORICAL  CHIEF COMPLAINT:  Chief Complaint  Patient presents with  . Possible seizure    Rm 7 New Pt  ED Referral, dgtr- Erica  "I think I cut the circulation to my brain and passed out; I do have a numb place in my scalp, left side"     HISTORY OF PRESENT ILLNESS:   55 year old male here for evaluation of seizure for syncope.  03/30/2021 patient was outside with family working on [employer].  He was working with his family for about 30 minutes on this.  He was standing and leaning over holding up a hood so his brothers could work on the generator.  They were outdoors with good ventilation.  Patient felt some lightheadedness and had to step away for a few moments.  He returned to hold the head again and then ended up passing out.  He fell down hard without warning and struck his left face and body on hard concrete.  After he fell down he began to shake with convulsions.  These lasted for a few minutes.  Paramedics were called to scene.  He had severe hematoma over his left forehead.  He also had some slurred speech and difficulty getting words out.  He woke up in the ambulance.  He went to the hospital for evaluation.  CT of the head and lab testing were unremarkable.      REVIEW OF SYSTEMS: Full 14 system review of systems performed and negative with exception of: as per HPI.  ALLERGIES: No Known Allergies  HOME MEDICATIONS: Outpatient Medications Prior to Visit  Medication Sig Dispense Refill  . Blood Glucose Monitoring Suppl (TRUE METRIX METER) w/Device KIT 1 Device by Does not apply route 3 (three) times daily. 1 kit 0  . carvedilol (COREG) 12.5 MG tablet Take 1 tablet (12.5 mg total) by mouth 2 (two) times daily with a meal. 180 tablet 1  . furosemide (LASIX) 80 MG tablet TAKE 1 TABLET(80 MG)  BY MOUTH DAILY 90 tablet 0  . glucose blood (TRUE METRIX BLOOD GLUCOSE TEST) test strip Use as instructed 100 each 12  . hydrALAZINE (APRESOLINE) 50 MG tablet Take 1 tablet (50 mg total) by mouth 2 (two) times daily. 60 tablet 0  . potassium chloride SA (KLOR-CON) 20 MEQ tablet Take 2 tablets (40 mEq total) by mouth daily. 180 tablet 1  . spironolactone (ALDACTONE) 25 MG tablet Take 1 tablet (25 mg total) by mouth daily. 30 tablet 0  . TRUEPLUS SAFETY LANCETS 28G MISC 1 Stick by Does not apply route 3 (three) times daily. 100 each 3  . atorvastatin (LIPITOR) 20 MG tablet Take 1 tablet (20 mg total) by mouth daily. (Patient not taking: Reported on 04/08/2021) 90 tablet 1  . ENTRESTO 49-51 MG TAKE 1 TABLET BY MOUTH TWICE DAILY (Patient not taking: Reported on 04/08/2021) 180 tablet 0   No facility-administered medications prior to visit.    PAST MEDICAL HISTORY: Past Medical History:  Diagnosis Date  . CHF (congestive heart failure) ([place])   . Hypertension     PAST SURGICAL HISTORY: No past surgical history on file.  FAMILY HISTORY: Family History  Problem Relation Age of Onset  . Diabetes Mother   . Hypertension Mother   . Diabetes Father     SOCIAL HISTORY: Social History   Socioeconomic  History  . Marital status: Single    Spouse name: Not on file  . Number of children: 2  . Years of education: Not on file  . Highest education level: High school graduate  Occupational History  . Not on file  Tobacco Use  . Smoking status: Former Research scientist (life sciences)  . Smokeless tobacco: Never Used  . Tobacco comment: quit 15 years ago  Vaping Use  . Vaping Use: Never used  Substance and Sexual Activity  . Alcohol use: Yes    Comment: occ  . Drug use: Yes    Types: Marijuana    Comment: 04/08/21 almost daily  . Sexual activity: Not on file  Other Topics Concern  . Not on file  Social History Narrative  . Not on file   Social Determinants of Health   Financial Resource Strain: Not on file   Food Insecurity: Not on file  Transportation Needs: Not on file  Physical Activity: Not on file  Stress: Not on file  Social Connections: Not on file  Intimate Partner Violence: Not on file     PHYSICAL EXAM  GENERAL EXAM/CONSTITUTIONAL: Vitals:  Vitals:   04/08/21 1412  BP: (!) 164/99  Weight: 278 lb (126.1 kg)  Height: _0  (1.905 m)   Body mass index is 34.75 kg/m. Wt Readings from Last 3 Encounters:  04/08/21 278 lb (126.1 kg)  03/30/21 281 lb 1.4 oz (127.5 kg)  11/24/18 263 lb (119.3 kg)    Patient is in no distress; well developed, nourished and groomed; neck is supple  CARDIOVASCULAR:  Examination of carotid arteries is normal; no carotid bruits  Regular rate and rhythm, no murmurs  Examination of peripheral vascular system by observation and palpation is normal  EYES:  Ophthalmoscopic exam of optic discs and posterior segments is normal; no papilledema or hemorrhages No exam data present  MUSCULOSKELETAL:  Gait, strength, tone, movements noted in Neurologic exam below  NEUROLOGIC: MENTAL STATUS:  No flowsheet data found.  awake, alert, oriented to person, place and time  recent and remote memory intact  normal attention and concentration  language fluent, comprehension intact, naming intact  fund of knowledge appropriate  CRANIAL NERVE:   2nd - no papilledema on fundoscopic exam  2nd, 3rd, 4th, 6th - pupils equal and reactive to light, visual fields full to confrontation, extraocular muscles intact, no nystagmus  5th - facial sensation symmetric  7th - facial strength symmetric  8th - hearing intact  9th - palate elevates symmetrically, uvula midline  11th - shoulder shrug symmetric  12th - tongue protrusion midline  MOTOR:   normal bulk and tone, full strength in the BUE, BLE  SENSORY:   normal and symmetric to light touch, temperature, vibration  COORDINATION:   finger-nose-finger, fine finger movements  normal  REFLEXES:   deep tendon reflexes present and symmetric  GAIT/STATION:   narrow based gait     DIAGNOSTIC DATA (LABS, IMAGING, TESTING) - I reviewed patient records, labs, notes, testing and imaging myself where available.  Lab Results  Component Value Date   WBC 8.0 03/30/2021   HGB 15.3 03/30/2021   HCT 45.0 03/30/2021   MCV 91.2 03/30/2021   PLT 220 03/30/2021      Component Value Date/Time   NA 143 03/30/2021 1523   NA 140 06/15/2019 0917   K 3.9 03/30/2021 1523   CL 107 03/30/2021 1523   CO2 20 (L) 03/30/2021 1513   GLUCOSE 117 (H) 03/30/2021 1523   BUN 10 03/30/2021  1523   BUN 16 06/15/2019 0917   CREATININE 1.00 03/30/2021 1523   CALCIUM 9.3 03/30/2021 1513   PROT 7.1 03/30/2021 1513   PROT 6.9 06/15/2019 0917   ALBUMIN 4.0 03/30/2021 1513   ALBUMIN 4.6 06/15/2019 0917   AST 18 03/30/2021 1513   ALT 13 03/30/2021 1513   ALKPHOS 66 03/30/2021 1513   BILITOT 0.6 03/30/2021 1513   BILITOT 0.7 06/15/2019 0917   GFRNONAA >60 03/30/2021 1513   GFRAA 74 06/15/2019 0917   Lab Results  Component Value Date   CHOL 143 06/15/2019   HDL 40 06/15/2019   LDLCALC 78 06/15/2019   TRIG 126 06/15/2019   CHOLHDL 3.6 06/15/2019   Lab Results  Component Value Date   HGBA1C 6.5 (H) 06/15/2019   No results found for: VITAMINB12 No results found for: TSH   11/05/18 TTE - Left ventricle: The cavity size was mildly dilated. Wall  thickness was increased in a pattern of moderate LVH. Systolic  function was moderately reduced. The estimated ejection fraction  was in the range of 35% to 40%. Diffuse hypokinesis. Features are  consistent with a pseudonormal left ventricular filling pattern,  with concomitant abnormal relaxation and increased filling  pressure (grade 2 diastolic dysfunction).  - Pericardium, extracardiac: A trivial pericardial effusion was  identified posterior to the heart.    ASSESSMENT AND PLAN  55 y.o. year old male here  with:  Dx:  1. Syncope, unspecified syncope type      PLAN:  Loss of consciousness / post-concussion syndrome (syncope vs seizure; history of congestive heart failure and low EF raises possibility of syncopal event.  Convulsions and postictal confusion could be related to postconcussion seizure or seizure as the etiology of the event in entirety; we will proceed with further work-up)  - check MRI brain, EEG (seizure evaluation)  - follow up with PCP / cardiology evaluation for unprovoked syncope / CHF  - According to [place] law, you can not drive unless you are seizure / syncope free for at least 6 months and under physician's care.   - Please maintain precautions. Do not participate in activities where a loss of awareness could harm you or someone else. No swimming alone, no tub bathing, no hot tubs, no driving, no operating motorized vehicles (cars, ATVs, motocycles, etc), lawnmowers, power tools or firearms. No standing at heights, such as rooftops, ladders or stairs. Avoid hot objects such as stoves, heaters, open fires. Wear a helmet when riding a bicycle, scooter, skateboard, etc. and avoid areas of traffic. Set your water heater to 120 degrees or less.   Orders Placed This Encounter  Procedures  . MR BRAIN W WO CONTRAST  . EEG adult   Return in about 6 months (around 10/08/2021).    Penni Bombard, MD 01/03/2950, 8:84 PM Certified in Neurology, Neurophysiology and Neuroimaging  [facility] [address] [place], [place] [address] [phone]

## 2021-04-08 NOTE — Patient Instructions (Signed)
Loss of consciousness / post-concussion syndrome  - check MRI brain, EEG (seizure evaluation)  - follow up with PCP / cardiology evaluation for unprovoked syncope / CHF  - According to [place] law, you can not drive unless you are seizure / syncope free for at least 6 months and under physician's care.   - Please maintain precautions. Do not participate in activities where a loss of awareness could harm you or someone else. No swimming alone, no tub bathing, no hot tubs, no driving, no operating motorized vehicles (cars, ATVs, motocycles, etc), lawnmowers, power tools or firearms. No standing at heights, such as rooftops, ladders or stairs. Avoid hot objects such as stoves, heaters, open fires. Wear a helmet when riding a bicycle, scooter, skateboard, etc. and avoid areas of traffic. Set your water heater to 120 degrees or less.

## 2021-04-08 NOTE — Telephone Encounter (Signed)
[facility]: [phone] Approval Valid Through: 04/08/2021 - 05/07/2021 sent to GI

## 2021-04-08 NOTE — Telephone Encounter (Signed)
Requested medication (s) are due for refill today: yes  Requested medication (s) are on the active medication list: yes  Last refill: ?   Future visit scheduled: no  Notes to clinic:  no assigned protocol; no valid encounter within last 6 months; previous attempt to contact pt    Requested Prescriptions  Pending Prescriptions Disp Refills   ENTRESTO 49-51 MG [Pharmacy Med Name: ENTRESTO 49-51MG  TABLETS] 180 tablet 0    Sig: TAKE 1 TABLET BY MOUTH TWICE DAILY      Off-Protocol Failed - 04/08/2021  1:08 PM      Failed - Medication not assigned to a protocol, review manually.      Failed - Valid encounter within last 12 months    Recent Outpatient Visits           1 year ago Type 2 diabetes mellitus with other circulatory complication, without long-term current use of insulin (HCC)   [facility] [place], Odette Horns, MD   1 year ago Screening for colon cancer   [facility] [place], Odette Horns, MD   2 years ago Essential hypertension   [facility] [place], Odette Horns, MD   2 years ago Type 2 diabetes mellitus without complication, without long-term current use of insulin (HCC)   [facility] [place], Odette Horns, MD   2 years ago Type 2 diabetes mellitus without complication, without long-term current use of insulin (HCC)    [facility] Hoy Register, MD

## 2021-04-09 ENCOUNTER — Ambulatory Visit (INDEPENDENT_AMBULATORY_CARE_PROVIDER_SITE_OTHER): Payer: 59 | Admitting: Diagnostic Neuroimaging

## 2021-04-09 DIAGNOSIS — R55 Syncope and collapse: Secondary | ICD-10-CM

## 2021-04-20 ENCOUNTER — Other Ambulatory Visit: Payer: Self-pay

## 2021-04-20 ENCOUNTER — Ambulatory Visit
Admission: RE | Admit: 2021-04-20 | Discharge: 2021-04-20 | Disposition: A | Discharge status: Home / Self Care | Payer: 59 | Source: Ambulatory Visit | Attending: Diagnostic Neuroimaging | Admitting: Diagnostic Neuroimaging

## 2021-04-20 DIAGNOSIS — R55 Syncope and collapse: Secondary | ICD-10-CM

## 2021-04-20 MED ORDER — GADOBENATE DIMEGLUMINE 529 MG/ML IV SOLN
20.0000 mL | Freq: Once | INTRAVENOUS | Status: AC | PRN
  Administered 2021-04-20: 20 mL via INTRAVENOUS

## 2021-04-24 NOTE — Procedures (Signed)
   [facility]  EEG (ELECTROENCEPHALOGRAM) REPORT   STUDY DATE: 04/09/21 PATIENT NAME: Jim Garcia DOB: [birth year] MRN: [id number]  ORDERING CLINICIAN: Joycelyn Schmid, MD   TECHNOLOGIST: Ardis Hughs TECHNIQUE: Electroencephalogram was recorded utilizing standard 10-20 system of lead placement and reformatted into average and bipolar montages.  RECORDING TIME: 28 minutes  ACTIVATION: hyperventilation and photic stimulation  CLINICAL INFORMATION: 55 year old male with syncope vs seizure.  FINDINGS: Posterior dominant background rhythms, which attenuate with eye opening, ranging 10-11 hertz and 10-15 microvolts. No focal, lateralizing, epileptiform activity or seizures are seen. Patient recorded in the awake and drowsy state. EKG channel shows regular rhythm of 55-60 beats per minute.   IMPRESSION:   Normal EEG in the awake and drowsy states.   INTERPRETING PHYSICIAN:  Suanne Marker, MD Certified in Neurology, Neurophysiology and Neuroimaging  [facility] [address] [place], [place] [address] [phone]

## 2021-04-25 ENCOUNTER — Telehealth: Payer: Self-pay | Admitting: *Deleted

## 2021-04-25 NOTE — Telephone Encounter (Signed)
Spoke with patient and informed him the MRI brain results are unremarkable, EEG was normal. Advised he contineu with Dr Richrd Humbles plan - follow up with PCP / cardiology evaluation for unprovoked syncope / CHF   - According to [place] law, you can not drive unless you are seizure / syncope free for at least 6 months and under physician's care.   - Please maintain precautions. He has appointment with PCP soon. Patient verbalized understanding, appreciation.

## 2021-10-15 ENCOUNTER — Ambulatory Visit: Payer: 59 | Admitting: Diagnostic Neuroimaging

## 2021-10-15 ENCOUNTER — Encounter: Payer: Self-pay | Admitting: Diagnostic Neuroimaging

## 2022-01-26 DIAGNOSIS — I10 Essential (primary) hypertension: Secondary | ICD-10-CM | POA: Insufficient documentation

## 2022-01-26 DIAGNOSIS — R55 Syncope and collapse: Secondary | ICD-10-CM

## 2022-01-26 DIAGNOSIS — I5023 Acute on chronic systolic (congestive) heart failure: Secondary | ICD-10-CM | POA: Insufficient documentation

## 2022-01-26 HISTORY — DX: Essential (primary) hypertension: I10

## 2022-01-26 HISTORY — DX: Acute on chronic systolic (congestive) heart failure: I50.23

## 2022-01-26 HISTORY — DX: Syncope and collapse: R55

## 2022-02-21 ENCOUNTER — Other Ambulatory Visit: Payer: Self-pay | Admitting: Family Medicine

## 2022-02-21 NOTE — Telephone Encounter (Signed)
Duplicate request. ?Requested Prescriptions  ?Pending Prescriptions Disp Refills  ?? metoprolol tartrate (LOPRESSOR) 25 MG tablet [Pharmacy Med Name: METOPROLOL TARTRATE 25MG   TABLETS] 90 tablet   ?  Sig: TAKE 1/2 TABLET(12.5 MG) BY MOUTH TWICE DAILY  ?  ? Cardiovascular:  Beta Blockers Failed - 02/21/2022  4:26 PM  ?  ?  Failed - Last BP in normal range  ?  BP Readings from Last 1 Encounters:  ?04/08/21 (!) 164/99  ?   ?  ?  Failed - Valid encounter within last 6 months  ?  Recent Outpatient Visits   ?      ? 2 years ago Type 2 diabetes mellitus with other circulatory complication, without long-term current use of insulin (HCC)  ? [facility] [place], [place], MD  ? 2 years ago Screening for colon cancer  ? [facility], MD  ? 2 years ago Essential hypertension  ? [facility] [place], [place], MD  ? 3 years ago Type 2 diabetes mellitus without complication, without long-term current use of insulin (HCC)  ? [facility] [place], [place], MD  ? 3 years ago Type 2 diabetes mellitus without complication, without long-term current use of insulin (HCC)  ? [facility], MD  ?  ?  ?Future Appointments   ?        ? In 5 days Hoy Register, Sharon Seller [facility]  ?  ? ?  ?  ?  Passed - Last Heart Rate in normal range  ?  Pulse Readings from Last 1 Encounters:  ?03/30/21 85  ?   ?  ?  ? ? ?

## 2022-02-21 NOTE — Telephone Encounter (Signed)
Pt has future OV 02/26/22, will refill medication. ? ?Requested Prescriptions  ?Pending Prescriptions Disp Refills  ?? furosemide (LASIX) 40 MG tablet [Pharmacy Med Name: FUROSEMIDE 40MG  TABLETS] 60 tablet   ?  Sig: TAKE 1 TABLET(40 MG) BY MOUTH TWICE DAILY  ?  ? Cardiovascular:  Diuretics - Loop Failed - 02/21/2022  3:55 PM  ?  ?  Failed - K in normal range and within 180 days  ?  Potassium  ?Date Value Ref Range Status  ?03/30/2021 3.9 3.5 - 5.1 mmol/L Final  ?   ?  ?  Failed - Ca in normal range and within 180 days  ?  Calcium  ?Date Value Ref Range Status  ?03/30/2021 9.3 8.9 - 10.3 mg/dL Final  ? ?Calcium, Ion  ?Date Value Ref Range Status  ?03/30/2021 1.10 (L) 1.15 - 1.40 mmol/L Final  ?   ?  ?  Failed - Na in normal range and within 180 days  ?  Sodium  ?Date Value Ref Range Status  ?03/30/2021 143 135 - 145 mmol/L Final  ?06/15/2019 140 134 - 144 mmol/L Final  ?   ?  ?  Failed - Cr in normal range and within 180 days  ?  Creatinine, Ser  ?Date Value Ref Range Status  ?03/30/2021 1.00 0.61 - 1.24 mg/dL Final  ?   ?  ?  Failed - Cl in normal range and within 180 days  ?  Chloride  ?Date Value Ref Range Status  ?03/30/2021 107 98 - 111 mmol/L Final  ?   ?  ?  Failed - Mg Level in normal range and within 180 days  ?  No results found for: MG   ?  ?  Failed - Last BP in normal range  ?  BP Readings from Last 1 Encounters:  ?04/08/21 (!) 164/99  ?   ?  ?  Failed - Valid encounter within last 6 months  ?  Recent Outpatient Visits   ?      ? 2 years ago Type 2 diabetes mellitus with other circulatory complication, without long-term current use of insulin ([place])  ? [place], Charlane Ferretti, MD  ? 2 years ago Screening for colon cancer  ? [place], MD  ? 2 years ago Essential hypertension  ? [facility] [place], Charlane Ferretti, MD  ? 3 years ago Type 2 diabetes mellitus without complication, without long-term current  use of insulin ([place])  ? [facility] [place], Charlane Ferretti, MD  ? 3 years ago Type 2 diabetes mellitus without complication, without long-term current use of insulin ([place])  ? [place] Charlott Rakes, MD  ?  ?  ?Future Appointments   ?        ? In 5 days Thereasa Solo, [place] Bilis [place]  ?  ? ?  ?  ?  ?? metoprolol tartrate (LOPRESSOR) 25 MG tablet [Pharmacy Med Name: METOPROLOL TARTRATE 25MG   TABLETS] 30 tablet   ?  Sig: TAKE 1/2 TABLET(12.5 MG) BY MOUTH TWICE DAILY  ?  ? Cardiovascular:  Beta Blockers Failed - 02/21/2022  3:55 PM  ?  ?  Failed - Last BP in normal range  ?  BP Readings from Last 1 Encounters:  ?04/08/21 (!) 164/99  ?   ?  ?  Failed - Valid encounter within last 6 months  ?  Recent Outpatient Visits   ?      ? 2 years ago Type 2 diabetes mellitus with other circulatory complication, without long-term current use of insulin ([place])  ? [place], Charlane Ferretti, MD  ? 2 years ago Screening for colon cancer  ? [place], MD  ? 2 years ago Essential hypertension  ? [facility] [place], Charlane Ferretti, MD  ? 3 years ago Type 2 diabetes mellitus without complication, without long-term current use of insulin ([place])  ?  [facility] [place], Charlane Ferretti, MD  ? 3 years ago Type 2 diabetes mellitus without complication, without long-term current use of insulin ([place])  ? [place] Charlott Rakes, MD  ?  ?  ?Future Appointments   ?        ? In 5 days Thereasa Solo, Casimer Bilis [place]  ?  ? ?  ?  ?  Passed - Last Heart Rate in normal range  ?  Pulse Readings from Last 1 Encounters:  ?03/30/21 85  ?   ?  ?  ? ? ?

## 2022-02-26 ENCOUNTER — Encounter: Payer: Self-pay | Admitting: Physician Assistant

## 2022-02-26 ENCOUNTER — Ambulatory Visit: Payer: 59 | Attending: Physician Assistant | Admitting: Physician Assistant

## 2022-02-26 VITALS — BP 160/98 | HR 61 | Wt 247.0 lb

## 2022-02-26 DIAGNOSIS — E119 Type 2 diabetes mellitus without complications: Secondary | ICD-10-CM | POA: Diagnosis not present

## 2022-02-26 DIAGNOSIS — Z09 Encounter for follow-up examination after completed treatment for conditions other than malignant neoplasm: Secondary | ICD-10-CM

## 2022-02-26 DIAGNOSIS — I5043 Acute on chronic combined systolic (congestive) and diastolic (congestive) heart failure: Secondary | ICD-10-CM

## 2022-02-26 DIAGNOSIS — E782 Mixed hyperlipidemia: Secondary | ICD-10-CM

## 2022-02-26 DIAGNOSIS — I10 Essential (primary) hypertension: Secondary | ICD-10-CM

## 2022-02-26 DIAGNOSIS — I428 Other cardiomyopathies: Secondary | ICD-10-CM | POA: Diagnosis not present

## 2022-02-26 LAB — POCT GLYCOSYLATED HEMOGLOBIN (HGB A1C): HbA1c, POC (controlled diabetic range): 6.1 % (ref 0.0–7.0)

## 2022-02-26 LAB — GLUCOSE, POCT (MANUAL RESULT ENTRY): POC Glucose: 118 mg/dl — AB (ref 70–99)

## 2022-02-26 MED ORDER — HYDRALAZINE HCL 50 MG PO TABS: 50.0000 mg | ORAL_TABLET | Freq: Three times a day (TID) | ORAL | 1 refills | Status: DC

## 2022-02-26 MED ORDER — ISOSORBIDE MONONITRATE ER 30 MG PO TB24: 60.0000 mg | ORAL_TABLET | Freq: Every day | ORAL | 1 refills | Status: DC

## 2022-02-26 MED ORDER — LOSARTAN POTASSIUM 25 MG PO TABS: 25.0000 mg | ORAL_TABLET | Freq: Every day | ORAL | 0 refills | Status: DC

## 2022-02-26 MED ORDER — FUROSEMIDE 40 MG PO TABS: ORAL_TABLET | ORAL | 0 refills | Status: DC

## 2022-02-26 MED ORDER — METOPROLOL TARTRATE 25 MG PO TABS: 12.5000 mg | ORAL_TABLET | Freq: Two times a day (BID) | ORAL | 1 refills | Status: DC

## 2022-02-26 MED ORDER — SPIRONOLACTONE 25 MG PO TABS: 25.0000 mg | ORAL_TABLET | Freq: Every day | ORAL | 1 refills | Status: DC

## 2022-02-26 MED ORDER — ATORVASTATIN CALCIUM 20 MG PO TABS: 20.0000 mg | ORAL_TABLET | Freq: Every day | ORAL | 1 refills | Status: DC

## 2022-02-26 NOTE — Progress Notes (Signed)
Patient ID: Jim Garcia, male   DOB: 23-Jan-1966, 56 y.o.   MRN: [id number] ? ? ?Jim Garcia, is a 56 y.o. male ? ?LGX:[id number] ? ?[id number]:[id number] ? ?DOB - [birth year] ? ?Chief Complaint  ?Patient presents with  ? Hospitalization Follow-up  ?    ? ?Subjective:  ? ?Jim Garcia is a 56 y.o. male here today for a follow up visit after hospitalization for syncope.  He has not made an appt with cardiology yet and wants me to place a referral.  No CP or SOB.  Feeling good.  Complaint with meds but did not take them this morning.  No further episodes of syncope.  He had an extensive workup with imaging, labs, cardiac without any acute findings.  Med list updated today.  He did not get entresto yet so he is still on losartan for now.   ? ?From discharge summary: ? ?Admit date: 01/26/2022 ?Discharge date: 01/27/2022 ? ?Admitting Physician: Mhorys Steva Colder, MD ?Discharge Physician: Mhorys Steva Colder, MD ? ?Discharge Diagnoses:  ?Principal Problem (Resolved): ?Acute respiratory failure with hypoxia (HCC) ?Active Problems: ?Syncopal episodes ?Stage 2 hypertension ?Acute on chronic systolic heart failure (HCC) ? ?Jim Garcia is a 56 y.o. male with a pmhx of systolic heart failure, OSA on cpap, Essential hypertension, possible seizure disorder that presented to [facility] ED via EMS transport for complaints of syncopal event. The patient does not remember the event and the friend that witnessed the event is unavailable. So ED documentation was used to assist with the HPI. According to ED documentation, the patient stood up from eating breakfast to walk into the other room became lightheaded, looked like he was going to pass out And his significant other caught him and they both feel to the floor. He was unconscious for 20 to 30 seconds, had generalized shaking and altered mental status, and started to come around after 30 seconds, was confused so the significant other called EMS. The patient states when he awoke he was surrounded by  EMS, he was feeling dizzy but it soon went away and He was back at his baseline. He was brought to the ED for further evaluation. The patient denies history of seizures. When looking at his medical records from 2022 he was seen at [facility] neurology for a similar scenario where he went unconscious and had a shaking fit, he underwent an MRI and EEG which were both unremarkable. He was recommended to follow up with Dr. Richrd Humbles (neurology), his PCP and cardiology for unprovoked syncope/CHF. The patient states he has not followed up with his cardiologist or PCP , has not been taking some of his heart failure medications but is unsure of which.  ? ?Hospital Course:  ? ?Presented for syncopal episode. Work-up here has been negative. Echo still shows depressed EF which is chronic. CTA ruled out a PE. Placed on telemetry which noted bradycardia as patient was taking carvedilol at home. Heart rate as low as in the 40s. No further issues. Plan is for outpatient Zio patch. ?Unclear etiology for syncope however could be related to low heart rates. Patient has been taking Coreg of unclear dosage. Heart rate here has been in the 40s. After medicine washout on heart rate did improve into the 70s low 80s. ? ?Acute hypoxic respiratory failure due to aspiration pneumonia-opacity noted on chest x-ray to left lower lobe. Also noted on CT chest. Likely from possible aspiration as episode happened soon after breakfast. He was started Rocephin and azithromycin for admission. He did  require 4 L nasal cannula on admission. This was quickly weaned off. On examination patient was on room air with sats of 99%. No fevers or chills reported. ? ?Patient was also treated for chronic systolic heart failure. Patient has not been compliant with heart failure medication as a switch insurance and could no longer afford Entresto. He has been taking aspirin and Coreg at home daily otherwise no other heart failure medications. Here he was initially  started on Entresto but given his insurance does not cover this was discontinued. His heart rate improved from being in the 40s to the mid 70s to low 80s. He will be started on low-dose Lopressor 12.5 twice daily, losartan, hydralazine, Imdur, Aldactone. Also placed on Lasix 40 mg twice daily. Plan is to continue losartan until patient is able to change insurance and restart Entresto. Hospitalist at home will follow up to assist with blood pressure management as well. ? ?Patient was noted to have elevated blood pressures on admission here into the 180s systolic. Medications were adjusted to improve blood pressure. Hospitalist at home will follow-up ? ?Patient was stable on room air will be discharged home with follow-up with PCP, referral to cardiology ? ?Lab Results  ?Component Value Date/Time  ?WBC 10.7 01/27/2022 0015  ?RBC 5.43 01/27/2022 0015  ?HGB 16.1 01/27/2022 0015  ?HCT 47.7 01/27/2022 0015  ?PLT 218 01/27/2022 0015  ? ?Lab Results  ?Component Value Date  ?WBC 10.7 01/27/2022  ?HGB 16.1 01/27/2022  ?HCT 47.7 01/27/2022  ?PLT 218 01/27/2022  ? ?Lab Results  ?Component Value Date  ?CO2 27 01/27/2022  ?BUN 9 01/27/2022  ?CREATININE 0.86 01/27/2022  ?CALCIUM 8.8 01/27/2022  ?ALBUMIN 4.4 07/24/2015  ?AST 15 07/24/2015  ?HPI  ?Jim Garcia is a 56 y.o. male with a pmhx of systolic heart failure, OSA on cpap, Essential hypertension, possible seizure disorder that presented to [facility] ED via EMS transport for complaints of syncopal event. The patient does not remember the event and the friend that witnessed the event is unavailable. So ED documentation was used to assist with the HPI. According to ED documentation, the patient stood up from eating breakfast to walk into the other room became lightheaded, looked like he was going to pass out And his significant other caught him and they both feel to the floor. He was unconscious for 20 to 30 seconds, had generalized shaking and altered mental status, and started to  come around after 30 seconds, was confused so the significant other called EMS. The patient states when he awoke he was surrounded by EMS, he was feeling dizzy but it soon went away and He was back at his baseline. He was brought to the ED for further evaluation. The patient denies history of seizures. When looking at his medical records from 2022 he was seen at [facility] neurology for a similar scenario where he went unconscious and had a shaking fit, he underwent an MRI and EEG which were both unremarkable. He was recommended to follow up with Dr. Richrd Humbles (neurology), his PCP and cardiology for unprovoked syncope/CHF. The patient states he has not followed up with his cardiologist or PCP , has not been taking some of his heart failure medications but is unsure of which.  ? ?The patient denies fevers, chills ,night sweats, headache, shortness of breath, chest pain, hx of seizures , cough, orthopnea ? ?ED course: patient arrived to the ED via EMS transport T 97.40F, pulse 75, RR 18, bp 163/91, SPO2 81% on room air. CBC  w/diff unremarkable, CMP glucose 176, troponin 35, BNP 166, Mg 2.0, UDS positive for THC, CXR Findings are concerning for probable left lower lobe bronchopneumonia or sequela of aspiration. Sinus rhythm with sinus arrhythmia, LBB. No significant change when compared to 07/26/2015.  ? ?Assessment and Plan  ? ? ?RESPIRATORY FAILURE  ? ?TYPE OF RESPIRATORY FAILURE: Acute ? ?OBJECTIVE CRITERIA: ?- SpO2 (pulse oximetry) < 90 % breathing room air ? ?CLINICAL FEATURES: ?- Necessity of supplemental oxygen > 4 liters to maintain Sp02 > 90% ? ?CLASSIFICATION: ?Hypoxemic (Type I) ? ?EXISTING UNDERLYING ASSOCIATED CONDITIONS: ?- Heart Failure ?#Community acquired pneumonia secondary to above  ?-wean o2 to keep spo2 >92% ?-azithromycin, and ceftriaxone ?-incentive spirometer ? ?#Syncopal episode ?-CT head negative  ?-elevated troponin, new oxygen requirement will obtain CTA of chest to rule out PE  ?-2D echo  to establish EF, cardiac structure and function ?-telemetry ?-orthostatic blood pressures  ? ?#Hypertension stage II ?-continue hydralazine, Entresto ?-hydralazine IV prn for sbp >180 or DBP >110 ? ?#Ac

## 2022-03-28 ENCOUNTER — Other Ambulatory Visit: Payer: Self-pay | Admitting: Family Medicine

## 2022-03-28 DIAGNOSIS — I10 Essential (primary) hypertension: Secondary | ICD-10-CM

## 2022-03-28 DIAGNOSIS — I5043 Acute on chronic combined systolic (congestive) and diastolic (congestive) heart failure: Secondary | ICD-10-CM

## 2022-03-31 NOTE — Progress Notes (Unsigned)
Cardiology Office Note:    Date:  04/01/2022   ID:  Jim Garcia, DOB 17-Jul-1966, MRN [id number]  PCP:  Hoy Register, MD  Cardiologist:  Norman Herrlich, MD   Referring MD: Anders Simmonds, PA-C  ASSESSMENT:    1. Hypertensive heart disease with heart failure (HCC)   2. Obesity (BMI 30.0-34.9)   3. Type 2 diabetes mellitus without complication, without long-term current use of insulin (HCC)   4. Syncope, unspecified syncope type    PLAN:    In order of problems listed above:  Improved from last note trend and record his blood pressures at home we will uptitrate his Sherryll Burger and continue his guideline therapy including loop diuretic MRA beta-blocker Entresto and hydralazine. He has had significant weight loss I asked him to continue to be meticulous Stable not on oral meds at this time No recurrence hold off on the monitor at this time  Next appointment 3 months   Medication Adjustments/Labs and Tests Ordered: Current medicines are reviewed at length with the patient today.  Concerns regarding medicines are outlined above.  No orders of the defined types were placed in this encounter.  No orders of the defined types were placed in this encounter.    Chief Complaint  Patient presents with   Follow-up   Congestive Heart Failure   Cardiomyopathy    History of Present Illness:    Jim Garcia is a 56 y.o. male with a history of hypertensive heart disease cardiomyopathy systolic heart failure type 2 diabetes hyperlipidemia and seizure disorder versus syncope who is being seen today for the evaluation of heart failure at the request of Anders Simmonds, PA-C.  He was previously seen [facility] cardiology [facility] for hypertensive heart disease previous normal coronary arteriography and was described as severe left ventricular dysfunction in March 2017.  He was admitted at [facility] 01/26/2022 with decompensated heart failure hypoxia and syncope.  His  admission note relates a background history of systolic heart failure and obstructive sleep apnea along with hypertension and a possible seizure disorder.  He is found to have aspiration pneumonia on his chest x-ray and was treated with IV antibiotics and supplemental oxygen.  He related he was unable to afford Entresto in the past as hypertension was treated with beta-blocker ARB and hydralazine isosorbide along with Aldactone and loop diuretic.  Laboratory studies showed a hemoglobin of 16.1 platelets 218,000 creatinine 0.86 sodium 140 potassium 3.4 CTA of the chest showed no findings of pulmonary embolism and findings consistent with heart failure and a dilated left ventricle.  His cardiac echo 01/27/2022 showed moderate concentric LVH moderate to severe dilation left ventricle ejection fraction estimated 30 to 35% with reduced GLS -9.7% and diastolic function suggested mild elevated pulmonary capillary wedge pressure.  The right ventricle is normal in size the atria are normal in size and there is no significant valvular abnormality.  [facility]: Procedure Type   Diagnostic procedure: Left Heart Cath  Complications: No Complications.  Conclusions  Diagnostic Procedure Summary  There is NO obstructive CAD.  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  Severe global LV dysfunction.EF 30%  Diagnostic Procedure Recommendations  Medical Therapy because no interventional therapy is required.  Medical therapy for LV dysfunction.   Signatures   Electronically signed by Merrily Pew, MD(Diagnostic Physician)   on 07/26/2015 08:44   Angiographic findings   Cardiac Arteries and Lesion Findings  LMCA: Normal.  LAD: Normal.  LCx: Normal.  RCA:  Aneurysmal.  Ramus: Normal.  Procedure Data  Procedure Date  Date: 07/26/2015 Start: 08:27  Contrast Material    - Omnipaque88 ml  Fluoroscopy Time: Diagnostic: 2:05 minutes. Total: 2:05 minutes.  Admission Data  Admission  Date: 07/26/2015  Admission Status: Outpatient.  [facility]  LV function assessed [id number].  Ejection Fraction    - Method: LV gram. EF%: 30.   In retrospect he had stopped all of his cardiac medications before his recent hospitalization He has been Feeling much better has lost over 50 pounds has had no cardiovascular symptoms of edema shortness of breath chest pain palpitation or syncope he is very reluctant to wear a monitor Past Medical History:  Diagnosis Date   Acute on chronic combined systolic and diastolic CHF, NYHA class 3 (HCC) 04/14/2018   Acute on chronic systolic heart failure (HCC) 01/26/2022   CHF (congestive heart failure) (HCC)    Edema 06/12/2015   Essential hypertension 04/15/2018   Hyperlipidemia 06/12/2015   Hypokalemia    Morbid obesity due to excess calories (HCC) 06/13/2015   Non-ischemic cardiomyopathy (HCC) 04/15/2018   Obesity (BMI 30.0-34.9) 11/24/2018   Sleep apnea, obstructive 04/15/2018   Stage 2 hypertension 01/26/2022   Syncopal episodes 01/26/2022   Type 2 diabetes mellitus without complication (HCC) 07/24/2015    Past Surgical History:  Procedure Laterality Date   NO PAST SURGERIES      Current Medications: Current Meds  Medication Sig   atorvastatin (LIPITOR) 20 MG tablet Take 1 tablet (20 mg total) by mouth daily.   ENTRESTO 49-51 MG TAKE 1 TABLET BY MOUTH TWICE DAILY   furosemide (LASIX) 40 MG tablet TAKE 1 TABLET(40 MG) BY MOUTH TWICE DAILY   hydrALAZINE (APRESOLINE) 50 MG tablet Take 1 tablet (50 mg total) by mouth 3 (three) times daily.   isosorbide mononitrate (IMDUR) 30 MG 24 hr tablet Take 2 tablets (60 mg total) by mouth daily.   losartan (COZAAR) 25 MG tablet Take 1 tablet (25 mg total) by mouth daily.   metoprolol tartrate (LOPRESSOR) 25 MG tablet TAKE 1/2 TABLET(12.5 MG) BY MOUTH TWICE DAILY   spironolactone (ALDACTONE) 25 MG tablet Take 1 tablet (25 mg total) by mouth daily.     Allergies:   Patient has no known allergies.   Social History    Socioeconomic History   Marital status: Single    Spouse name: Not on file   Number of children: 2   Years of education: Not on file   Highest education level: High school graduate  Occupational History   Not on file  Tobacco Use   Smoking status: Former   Smokeless tobacco: Never   Tobacco comments:    quit 15 years ago  Vaping Use   Vaping Use: Never used  Substance and Sexual Activity   Alcohol use: Yes    Comment: occ   Drug use: Yes    Types: Marijuana    Comment: 04/08/21 almost daily   Sexual activity: Not on file  Other Topics Concern   Not on file  Social History Narrative   Not on file   Social Determinants of Health   Financial Resource Strain: Not on file  Food Insecurity: Not on file  Transportation Needs: Not on file  Physical Activity: Not on file  Stress: Not on file  Social Connections: Not on file     Family History: The patient's family history includes Diabetes in his father and mother; Hypertension in his mother.  ROS:   ROS Please see  the history of present illness.     All other systems reviewed and are negative.  EKGs/Labs/Other Studies Reviewed:    The following studies were reviewed today:    Physical Exam:    VS:  BP (!) 142/80   Pulse 76   Ht 6\' 3"  (1.905 m)   Wt 251 lb 1.9 oz (113.9 kg)   SpO2 95%   BMI 31.39 kg/m     Wt Readings from Last 3 Encounters:  04/01/22 251 lb 1.9 oz (113.9 kg)  02/26/22 247 lb (112 kg)  04/08/21 278 lb (126.1 kg)     GEN:  Well nourished, well developed in no acute distress HEENT: Normal NECK: No JVD; No carotid bruits LYMPHATICS: No lymphadenopathy CARDIAC: RRR, no murmurs, rubs, gallops RESPIRATORY:  Clear to auscultation without rales, wheezing or rhonchi  ABDOMEN: Soft, non-tender, non-distended MUSCULOSKELETAL:  No edema; No deformity  SKIN: Warm and dry NEUROLOGIC:  Alert and oriented x 3 PSYCHIATRIC:  Normal affect     Signed, 06/08/21, MD  04/01/2022 2:04 PM    [facility]

## 2022-04-01 ENCOUNTER — Ambulatory Visit: Payer: 59 | Admitting: Cardiology

## 2022-04-01 ENCOUNTER — Encounter: Payer: Self-pay | Admitting: Cardiology

## 2022-04-01 VITALS — BP 142/80 | HR 76 | Ht 75.0 in | Wt 251.1 lb

## 2022-04-01 DIAGNOSIS — E669 Obesity, unspecified: Secondary | ICD-10-CM | POA: Diagnosis not present

## 2022-04-01 DIAGNOSIS — R55 Syncope and collapse: Secondary | ICD-10-CM

## 2022-04-01 DIAGNOSIS — E119 Type 2 diabetes mellitus without complications: Secondary | ICD-10-CM | POA: Diagnosis not present

## 2022-04-01 DIAGNOSIS — I110 Hypertensive heart disease with heart failure: Secondary | ICD-10-CM

## 2022-04-01 MED ORDER — ENTRESTO 97-103 MG PO TABS: 1.0000 | ORAL_TABLET | Freq: Two times a day (BID) | ORAL | 3 refills | Status: DC

## 2022-04-01 NOTE — Patient Instructions (Signed)
Medication Instructions:  Your physician has recommended you make the following change in your medication:   STOP: Losartan START: Entresto 97/103 twice daily  *If you need a refill on your cardiac medications before your next appointment, please call your pharmacy*   Lab Work: None If you have labs (blood work) drawn today and your tests are completely normal, you will receive your results only by: [facility] (if you have MyChart) OR A paper copy in the mail If you have any lab test that is abnormal or we need to change your treatment, we will call you to review the results.   Testing/Procedures: None   Follow-Up: At [facility], you and your health needs are our priority.  As part of our continuing mission to provide you with exceptional heart care, we have created designated Provider Care Teams.  These Care Teams include your primary Cardiologist (physician) and Advanced Practice Providers (APPs -  Physician Assistants and Nurse Practitioners) who all work together to provide you with the care you need, when you need it.  We recommend signing up for the patient portal called "MyChart".  Sign up information is provided on this After Visit Summary.  MyChart is used to connect with patients for Virtual Visits (Telemedicine).  Patients are able to view lab/test results, encounter notes, upcoming appointments, etc.  Non-urgent messages can be sent to your provider as well.   To learn more about what you can do with MyChart, go to [email or web address].    Your next appointment:   3 month(s)  The format for your next appointment:   In Person  Provider:   Shirlee More, MD    Other Instructions None  Important Information About Sugar

## 2022-05-23 ENCOUNTER — Other Ambulatory Visit: Payer: Self-pay | Admitting: Physician Assistant

## 2022-05-23 DIAGNOSIS — I10 Essential (primary) hypertension: Secondary | ICD-10-CM

## 2022-05-26 ENCOUNTER — Other Ambulatory Visit: Payer: Self-pay | Admitting: Pharmacist

## 2022-05-26 DIAGNOSIS — I5043 Acute on chronic combined systolic (congestive) and diastolic (congestive) heart failure: Secondary | ICD-10-CM

## 2022-05-26 MED ORDER — FUROSEMIDE 40 MG PO TABS: ORAL_TABLET | ORAL | 0 refills | Status: DC

## 2022-05-28 ENCOUNTER — Ambulatory Visit: Payer: 59 | Admitting: Family Medicine

## 2022-06-03 ENCOUNTER — Other Ambulatory Visit: Payer: Self-pay | Admitting: Physician Assistant

## 2022-06-03 DIAGNOSIS — E782 Mixed hyperlipidemia: Secondary | ICD-10-CM

## 2022-06-03 DIAGNOSIS — I428 Other cardiomyopathies: Secondary | ICD-10-CM

## 2022-06-04 ENCOUNTER — Encounter: Payer: Self-pay | Admitting: Family Medicine

## 2022-06-04 ENCOUNTER — Ambulatory Visit: Payer: 59 | Attending: Family Medicine | Admitting: Family Medicine

## 2022-06-04 VITALS — BP 133/63 | HR 63 | Temp 98.2°F | Ht 75.0 in | Wt 238.0 lb

## 2022-06-04 DIAGNOSIS — I428 Other cardiomyopathies: Secondary | ICD-10-CM | POA: Diagnosis not present

## 2022-06-04 DIAGNOSIS — E1169 Type 2 diabetes mellitus with other specified complication: Secondary | ICD-10-CM | POA: Diagnosis not present

## 2022-06-04 DIAGNOSIS — Z1211 Encounter for screening for malignant neoplasm of colon: Secondary | ICD-10-CM | POA: Diagnosis not present

## 2022-06-04 DIAGNOSIS — Z1159 Encounter for screening for other viral diseases: Secondary | ICD-10-CM | POA: Diagnosis not present

## 2022-06-04 DIAGNOSIS — L0292 Furuncle, unspecified: Secondary | ICD-10-CM

## 2022-06-04 DIAGNOSIS — M25512 Pain in left shoulder: Secondary | ICD-10-CM

## 2022-06-04 DIAGNOSIS — G8929 Other chronic pain: Secondary | ICD-10-CM

## 2022-06-04 LAB — POCT GLYCOSYLATED HEMOGLOBIN (HGB A1C): HbA1c, POC (controlled diabetic range): 6 % (ref 0.0–7.0)

## 2022-06-04 MED ORDER — DAPAGLIFLOZIN PROPANEDIOL 5 MG PO TABS: 5.0000 mg | ORAL_TABLET | Freq: Every day | ORAL | 6 refills | Status: DC

## 2022-06-04 MED ORDER — PREDNISONE 20 MG PO TABS: 20.0000 mg | ORAL_TABLET | Freq: Every day | ORAL | 0 refills | Status: DC

## 2022-06-04 NOTE — Progress Notes (Signed)
Cant lift right shoulder

## 2022-06-04 NOTE — Progress Notes (Signed)
Subjective:  Patient ID: Jim Garcia, male    DOB: 1966/02/22  Age: 56 y.o. MRN: [id number]  CC: Hypertension   HPI Jim Garcia is a 56 y.o. year old male with a history of hypertension, nonischemic cardiomyopathy (EF 35 to 40% in 11/2018), Type II DM (A1c 6.1) who presents today for a follow up visit. He did have a hospitalization for acute respiratory failure secondary to community-acquired pneumonia, syncope in 01/2022 at [facility].  Interval History: Seen by cardiology 2 months ago at which time his Delene Loll was uptitrated with plans to follow-up in 3 months.  No plans for monitor for now per cardiology note as work-up for syncope. He did have a previous syncopal work-up in 04/2021 an EEG and MRI were unremarkable.   Diabetes is diet controlled and he has no hypoglycemic episodes, neuropathy or visual concerns.  He has had a left shoulder pain since he fell during his syncope in 01/2022 and sometimes has a limited ROM but this is not daily. Sometimes it feels like it is dislocated.  He has no tingling ir numbness, he is right hand dominant. He also has an occipital bump which is not draining and is unsure of duration.  Past Medical History:  Diagnosis Date   Acute on chronic combined systolic and diastolic CHF, NYHA class 3 ([place]) 04/14/2018   Acute on chronic systolic heart failure ([place]) 01/26/2022   CHF (congestive heart failure) ([place])    Edema 06/12/2015   Essential hypertension 04/15/2018   Hyperlipidemia 06/12/2015   Hypokalemia    Morbid obesity due to excess calories ([place]) 06/13/2015   Non-ischemic cardiomyopathy ([place]) 04/15/2018   Obesity (BMI 30.0-34.9) 11/24/2018   Sleep apnea, obstructive 04/15/2018   Stage 2 hypertension 01/26/2022   Syncopal episodes 01/26/2022   Type 2 diabetes mellitus without complication ([place]) 11/11/3233    Past Surgical History:  Procedure Laterality Date   NO PAST SURGERIES      Family History  Problem Relation Age of Onset   Diabetes  Mother    Hypertension Mother    Diabetes Father     Social History   Socioeconomic History   Marital status: Single    Spouse name: Not on file   Number of children: 2   Years of education: Not on file   Highest education level: High school graduate  Occupational History   Not on file  Tobacco Use   Smoking status: Former   Smokeless tobacco: Never   Tobacco comments:    quit 15 years ago  Vaping Use   Vaping Use: Never used  Substance and Sexual Activity   Alcohol use: Yes    Comment: occ   Drug use: Yes    Types: Marijuana    Comment: 04/08/21 almost daily   Sexual activity: Not on file  Other Topics Concern   Not on file  Social History Narrative   Not on file   Social Determinants of Health   Financial Resource Strain: Not on file  Food Insecurity: Not on file  Transportation Needs: Not on file  Physical Activity: Not on file  Stress: Not on file  Social Connections: Not on file    No Known Allergies  Outpatient Medications Prior to Visit  Medication Sig Dispense Refill   atorvastatin (LIPITOR) 20 MG tablet Take 1 tablet (20 mg total) by mouth daily. 90 tablet 1   Blood Glucose Monitoring Suppl (TRUE METRIX METER) w/Device KIT 1 Device by Does not apply route 3 (three) times  daily. 1 kit 0   furosemide (LASIX) 40 MG tablet TAKE 1 TABLET(40 MG) BY MOUTH TWICE DAILY 180 tablet 0   glucose blood (TRUE METRIX BLOOD GLUCOSE TEST) test strip Use as instructed 100 each 12   hydrALAZINE (APRESOLINE) 50 MG tablet Take 1 tablet (50 mg total) by mouth 3 (three) times daily. 270 tablet 1   metoprolol tartrate (LOPRESSOR) 25 MG tablet TAKE 1/2 TABLET(12.5 MG) BY MOUTH TWICE DAILY 90 tablet 1   sacubitril-valsartan (ENTRESTO) 97-103 MG Take 1 tablet by mouth 2 (two) times daily. 180 tablet 3   TRUEPLUS SAFETY LANCETS 28G MISC 1 Stick by Does not apply route 3 (three) times daily. 100 each 3   isosorbide mononitrate (IMDUR) 30 MG 24 hr tablet Take 2 tablets (60 mg total)  by mouth daily. 180 tablet 1   spironolactone (ALDACTONE) 25 MG tablet Take 1 tablet (25 mg total) by mouth daily. 90 tablet 1   No facility-administered medications prior to visit.     ROS Review of Systems  Constitutional:  Negative for activity change and appetite change.  HENT:  Negative for sinus pressure and sore throat.   Eyes:  Negative for visual disturbance.  Respiratory:  Negative for cough, chest tightness and shortness of breath.   Cardiovascular:  Negative for chest pain and leg swelling.  Gastrointestinal:  Negative for abdominal distention, abdominal pain, constipation and diarrhea.  Endocrine: Negative.   Genitourinary:  Negative for dysuria.  Musculoskeletal:  Negative for joint swelling and myalgias.  Skin:  Negative for rash.  Allergic/Immunologic: Negative.   Neurological:  Negative for weakness, light-headedness and numbness.  Psychiatric/Behavioral:  Negative for dysphoric mood and suicidal ideas.     Objective:  BP 133/63   Pulse 63   Temp 98.2 F (36.8 C) (Oral)   Ht 6' 3" (1.905 m)   Wt 238 lb (108 kg)   SpO2 100%   BMI 29.75 kg/m      06/04/2022    9:49 AM 04/01/2022    2:04 PM 04/01/2022    1:33 PM  BP/Weight  Systolic BP 818 299 371  Diastolic BP 63 80 76  Wt. (Lbs) 238  251.12  BMI 29.75 kg/m2  31.39 kg/m2      Physical Exam Constitutional:      Appearance: He is well-developed.  Cardiovascular:     Rate and Rhythm: Normal rate.     Heart sounds: Normal heart sounds. No murmur heard. Pulmonary:     Effort: Pulmonary effort is normal.     Breath sounds: Normal breath sounds. No wheezing or rales.  Chest:     Chest wall: No tenderness.  Abdominal:     General: Bowel sounds are normal. There is no distension.     Palpations: Abdomen is soft. There is no mass.     Tenderness: There is no abdominal tenderness.  Musculoskeletal:        General: Normal range of motion.     Right lower leg: No edema.     Left lower leg: No edema.   Neurological:     Mental Status: He is alert and oriented to person, place, and time.  Psychiatric:        Mood and Affect: Mood normal.    Diabetic Foot Exam - Simple   Simple Foot Form Diabetic Foot exam was performed with the following findings: Yes 06/04/2022 10:25 AM  Visual Inspection See comments: Yes Sensation Testing Intact to touch and monofilament testing bilaterally: Yes Pulse Check  Posterior Tibialis and Dorsalis pulse intact bilaterally: Yes Comments Thickened dystrophic great toenails, no ulceration or skin breakdown        Latest Ref Rng & Units 03/30/2021    3:23 PM 03/30/2021    3:13 PM 06/15/2019    9:17 AM  CMP  Glucose 70 - 99 mg/dL 117  122  131   BUN 6 - 20 mg/dL _0 Creatinine 0.61 - 1.24 mg/dL 1.00  1.14  1.29   Sodium 135 - 145 mmol/L 143  142  140   Potassium 3.5 - 5.1 mmol/L 3.9  4.0  4.5   Chloride 98 - 111 mmol/L 107  109  99   CO2 22 - 32 mmol/L  20  24   Calcium 8.9 - 10.3 mg/dL  9.3  9.5   Total Protein 6.5 - 8.1 g/dL  7.1  6.9   Total Bilirubin 0.3 - 1.2 mg/dL  0.6  0.7   Alkaline Phos 38 - 126 U/L  66  83   AST 15 - 41 U/L  18  13   ALT 0 - 44 U/L  13  13     Lipid Panel     Component Value Date/Time   CHOL 143 06/15/2019 0917   TRIG 126 06/15/2019 0917   HDL 40 06/15/2019 0917   CHOLHDL 3.6 06/15/2019 0917   LDLCALC 78 06/15/2019 0917    CBC    Component Value Date/Time   WBC 8.0 03/30/2021 1513   RBC 5.09 03/30/2021 1513   HGB 15.3 03/30/2021 1523   HCT 45.0 03/30/2021 1523   PLT 220 03/30/2021 1513   MCV 91.2 03/30/2021 1513   MCH 29.9 03/30/2021 1513   MCHC 32.8 03/30/2021 1513   RDW 14.0 03/30/2021 1513   LYMPHSABS 1.9 03/30/2021 1513   MONOABS 0.5 03/30/2021 1513   EOSABS 0.0 03/30/2021 1513   BASOSABS 0.0 03/30/2021 1513    Lab Results  Component Value Date   HGBA1C 6.0 06/04/2022    Assessment & Plan:  1. Type 2 diabetes mellitus with other specified complication, without long-term current use  of insulin (HCC) Diet controlled with A1c of 6.0 We will add on SGLT2i due to history of CHF Counseled on Diabetic diet, my plate method, 269 minutes of moderate intensity exercise/week Blood sugar logs with fasting goals of 80-120 mg/dl, random of less than 180 and in the event of sugars less than 60 mg/dl or greater than 400 mg/dl encouraged to notify the clinic. Advised on the need for annual eye exams, annual foot exams, Pneumonia vaccine. - LP+Non-HDL Cholesterol - Ambulatory referral to Podiatry - Basic Metabolic Panel; Future - POCT glycosylated hemoglobin (Hb A1C)  2. Screening for colon cancer - Cologuard  3. Need for hepatitis C screening test - HCV Ab w Reflex to Quant PCR  4. Non-ischemic cardiomyopathy (HCC) EF of 30 to 35% from echo of 11/2018 Euvolemic Currently on Entresto, spironolactone, beta-blocker, hydralazine and isosorbide We will add on SGLT2i - dapagliflozin propanediol (FARXIGA) 5 MG TABS tablet; Take 1 tablet (5 mg total) by mouth daily before breakfast.  Dispense: 30 tablet; Refill: 6  5. Chronic left shoulder pain Secondary to fall during syncope 5 months ago Suspicious for superimposed rotator cuff tendinopathy and underlying osteoarthritis Short course of prednisone and advised that if symptoms do not improved he might benefit from cortisone injection - DG Shoulder Left; Future - predniSONE (DELTASONE) 20 MG tablet; Take 1 tablet (20 mg total)  by mouth daily with breakfast.  Dispense: 5 tablet; Refill: 0  6. Furuncle Advised to apply heat No antibiotic indicated    Meds ordered this encounter  Medications   dapagliflozin propanediol (FARXIGA) 5 MG TABS tablet    Sig: Take 1 tablet (5 mg total) by mouth daily before breakfast.    Dispense:  30 tablet    Refill:  6   predniSONE (DELTASONE) 20 MG tablet    Sig: Take 1 tablet (20 mg total) by mouth daily with breakfast.    Dispense:  5 tablet    Refill:  0    Follow-up: Return in about 6  months (around 12/05/2022) for Chronic medical conditions.       Charlott Rakes, MD, FAAFP. [facility] [place], [place]   06/04/2022, 11:26 AM

## 2022-06-04 NOTE — Patient Instructions (Signed)

## 2022-06-05 ENCOUNTER — Other Ambulatory Visit: Payer: Self-pay

## 2022-06-05 DIAGNOSIS — E1169 Type 2 diabetes mellitus with other specified complication: Secondary | ICD-10-CM

## 2022-06-05 LAB — HCV AB W REFLEX TO QUANT PCR: HCV Ab: NONREACTIVE

## 2022-06-05 LAB — LP+NON-HDL CHOLESTEROL
Cholesterol, Total: 137 mg/dL (ref 100–199)
HDL: 48 mg/dL (ref >39)
LDL Chol Calc (NIH): 78 mg/dL (ref 0–99)
Total Non-HDL-Chol (LDL+VLDL): 89 mg/dL (ref 0–129)
Triglycerides: 47 mg/dL (ref 0–149)
VLDL Cholesterol Cal: 11 mg/dL (ref 5–40)

## 2022-06-05 LAB — HCV INTERPRETATION

## 2022-06-06 LAB — BASIC METABOLIC PANEL
BUN/Creatinine Ratio: 12 (ref 9–20)
BUN: 12 mg/dL (ref 6–24)
CO2: 22 mmol/L (ref 20–29)
Calcium: 9.4 mg/dL (ref 8.7–10.2)
Chloride: 101 mmol/L (ref 96–106)
Creatinine, Ser: 0.98 mg/dL (ref 0.76–1.27)
Glucose: 93 mg/dL (ref 70–99)
Potassium: 3.4 mmol/L — ABNORMAL LOW (ref 3.5–5.2)
Sodium: 142 mmol/L (ref 134–144)
eGFR: 92 mL/min/{1.73_m2} (ref >59)

## 2022-06-06 LAB — SPECIMEN STATUS REPORT

## 2022-06-19 ENCOUNTER — Ambulatory Visit: Payer: 59 | Admitting: Physician Assistant

## 2022-07-03 ENCOUNTER — Other Ambulatory Visit: Payer: Self-pay | Admitting: Family Medicine

## 2022-07-03 DIAGNOSIS — R195 Other fecal abnormalities: Secondary | ICD-10-CM

## 2022-07-03 LAB — COLOGUARD: COLOGUARD: POSITIVE — AB

## 2022-07-14 NOTE — Progress Notes (Unsigned)
Cardiology Office Note:    Date:  07/15/2022   ID:  Jim Garcia, DOB 27-Oct-1966, MRN [id number]  PCP:  Charlott Rakes, MD  Cardiologist:  Shirlee More, MD    Referring MD: Charlott Rakes, MD    ASSESSMENT:    1. Hypertensive heart disease with heart failure ([place])   2. Dilated cardiomyopathy ([place])   3. Type 2 diabetes mellitus without complication, without long-term current use of insulin ([place])   4. Hypokalemia    PLAN:    In order of problems listed above:  He is doing much better on his current medical regimen compliant and he is on good foundational therapy with his loop diuretic MRA beta-blocker and high-dose Entresto and mixed isosorbide hydralazine.  His heart rates in the 60s and not can uptitrate his beta-blocker.  I am concerned about blood pressure control he has a validated device and I have asked him to check with good technique for 2 weeks and send me a MyChart message.  We will plan to recheck echocardiogram in December and if EF remains severely reduced refer to EP for consideration of device therapy Stable diabetes recent A1c 6.0 Follow-up BMP in a few weeks on potassium supplement 20 mill equivalents daily recent K3.4   Next appointment: 6 months   Medication Adjustments/Labs and Tests Ordered: Current medicines are reviewed at length with the patient today.  Concerns regarding medicines are outlined above.  No orders of the defined types were placed in this encounter.  No orders of the defined types were placed in this encounter.  Chief complaint follow-up for heart failure   History of Present Illness:    Jim Garcia is a 56 y.o. male with a hx of hypertensive heart disease with heart failure chronic systolic and cardiomyopathy type 2 diabetes hyperlipidemia and seizure disorder versus syncope last seen 04/01/2022.  He was markedly improved with compensated heart failure weight loss of 50 pounds and resolution of edema and is continue on guideline  directed treatment including loop diuretic MRA beta-blocker hydralazine uptitrated on Entresto.   He was previously seen [facility] cardiology [facility] for hypertensive heart disease previous normal coronary arteriography and was described as severe left ventricular dysfunction in March 2017.   He was admitted at [facility] 01/26/2022 with decompensated heart failure hypoxia and syncope.  His admission note relates a background history of systolic heart failure and obstructive sleep apnea along with hypertension and a possible seizure disorder.  He is found to have aspiration pneumonia on his chest x-ray and was treated with IV antibiotics and supplemental oxygen.  He related he was unable to afford Entresto in the past as hypertension was treated with beta-blocker ARB and hydralazine isosorbide along with Aldactone and loop diuretic.   His cardiac echo 01/27/2022 showed moderate concentric LVH moderate to severe dilation left ventricle ejection fraction estimated 30 to 35% with reduced GLS [id number] and diastolic function suggested mild elevated pulmonary capillary wedge pressure.  The right ventricle is normal in size the atria are normal in size and there is no significant valvular abnormality.   Compliance with diet, lifestyle and medications: Yes  He is meticulous in his self-care but unfortunately forgot to take his medications before coming here and rarely checks his blood pressure at home Have asked him with good technique and validated device to check his home blood pressure and to send me a list in about 2 weeks He is very enthusiastic about his care is feeling much better and is  not having edema shortness of breath chest pain palpitation or syncope Recent labs are reassuring 0 06/04/2022 cholesterol 137 LDL 78 A1c 6.0 creatinine 8.31 potassium 3.4 Past Medical History:  Diagnosis Date   Acute on chronic combined systolic and diastolic CHF, NYHA class 3 (HCC) 04/14/2018   Acute on  chronic systolic heart failure (HCC) 01/26/2022   CHF (congestive heart failure) (HCC)    Edema 06/12/2015   Essential hypertension 04/15/2018   Hyperlipidemia 06/12/2015   Hypokalemia    Morbid obesity due to excess calories (HCC) 06/13/2015   Non-ischemic cardiomyopathy (HCC) 04/15/2018   Obesity (BMI 30.0-34.9) 11/24/2018   Sleep apnea, obstructive 04/15/2018   Stage 2 hypertension 01/26/2022   Syncopal episodes 01/26/2022   Type 2 diabetes mellitus without complication (HCC) 07/24/2015    Past Surgical History:  Procedure Laterality Date   NO PAST SURGERIES      Current Medications: Current Meds  Medication Sig   atorvastatin (LIPITOR) 20 MG tablet TAKE 1 TABLET(20 MG) BY MOUTH DAILY   dapagliflozin propanediol (FARXIGA) 5 MG TABS tablet Take 1 tablet (5 mg total) by mouth daily before breakfast.   furosemide (LASIX) 40 MG tablet TAKE 1 TABLET(40 MG) BY MOUTH TWICE DAILY   hydrALAZINE (APRESOLINE) 50 MG tablet Take 1 tablet (50 mg total) by mouth 3 (three) times daily.   isosorbide mononitrate (IMDUR) 30 MG 24 hr tablet Take 2 tablets (60 mg total) by mouth daily.   metoprolol tartrate (LOPRESSOR) 25 MG tablet TAKE 1/2 TABLET(12.5 MG) BY MOUTH TWICE DAILY   predniSONE (DELTASONE) 20 MG tablet Take 1 tablet (20 mg total) by mouth daily with breakfast.   sacubitril-valsartan (ENTRESTO) 97-103 MG Take 1 tablet by mouth 2 (two) times daily.   spironolactone (ALDACTONE) 25 MG tablet Take 1 tablet (25 mg total) by mouth daily.     Allergies:   Patient has no known allergies.   Social History   Socioeconomic History   Marital status: Single    Spouse name: Not on file   Number of children: 2   Years of education: Not on file   Highest education level: High school graduate  Occupational History   Not on file  Tobacco Use   Smoking status: Former   Smokeless tobacco: Never   Tobacco comments:    quit 15 years ago  Vaping Use   Vaping Use: Never used  Substance and Sexual Activity    Alcohol use: Yes    Comment: occ   Drug use: Yes    Types: Marijuana    Comment: 04/08/21 almost daily   Sexual activity: Not on file  Other Topics Concern   Not on file  Social History Narrative   Not on file   Social Determinants of Health   Financial Resource Strain: Not on file  Food Insecurity: Not on file  Transportation Needs: Not on file  Physical Activity: Not on file  Stress: Not on file  Social Connections: Not on file     Family History: The patient's family history includes Diabetes in his father and mother; Hypertension in his mother. ROS:   Please see the history of present illness.    All other systems reviewed and are negative.  EKGs/Labs/Other Studies Reviewed:    The following studies were reviewed today:    Recent Labs: 06/04/2022: BUN 12; Creatinine, Ser 0.98; Potassium 3.4; Sodium 142  Recent Lipid Panel    Component Value Date/Time   CHOL 137 06/04/2022 1038   TRIG 47 06/04/2022 1038  HDL 48 06/04/2022 1038   CHOLHDL 3.6 06/15/2019 0917   LDLCALC 78 06/04/2022 1038    Physical Exam:    VS:  BP (!) 148/80   Pulse 66   Ht 6\' 3"  (1.905 m)   Wt 232 lb (105.2 kg)   SpO2 98%   BMI 29.00 kg/m     Wt Readings from Last 3 Encounters:  07/15/22 232 lb (105.2 kg)  06/04/22 238 lb (108 kg)  04/01/22 251 lb 1.9 oz (113.9 kg)     GEN:  Well nourished, well developed in no acute distress HEENT: Normal NECK: No JVD; No carotid bruits LYMPHATICS: No lymphadenopathy CARDIAC: RRR, no murmurs, rubs, gallops RESPIRATORY:  Clear to auscultation without rales, wheezing or rhonchi  ABDOMEN: Soft, non-tender, non-distended MUSCULOSKELETAL:  No edema; No deformity  SKIN: Warm and dry NEUROLOGIC:  Alert and oriented x 3 PSYCHIATRIC:  Normal affect    Signed, 04/03/22, MD  07/15/2022 9:40 AM    [facility]

## 2022-07-15 ENCOUNTER — Ambulatory Visit: Payer: Commercial Managed Care - HMO | Attending: Cardiology | Admitting: Cardiology

## 2022-07-15 ENCOUNTER — Encounter: Payer: Self-pay | Admitting: Cardiology

## 2022-07-15 VITALS — BP 148/80 | HR 66 | Ht 75.0 in | Wt 232.0 lb

## 2022-07-15 DIAGNOSIS — I420 Dilated cardiomyopathy: Secondary | ICD-10-CM

## 2022-07-15 DIAGNOSIS — I110 Hypertensive heart disease with heart failure: Secondary | ICD-10-CM | POA: Diagnosis not present

## 2022-07-15 DIAGNOSIS — E876 Hypokalemia: Secondary | ICD-10-CM

## 2022-07-15 DIAGNOSIS — E119 Type 2 diabetes mellitus without complications: Secondary | ICD-10-CM | POA: Diagnosis not present

## 2022-07-15 MED ORDER — POTASSIUM CHLORIDE CRYS ER 20 MEQ PO TBCR: 20.0000 meq | EXTENDED_RELEASE_TABLET | Freq: Every day | ORAL | 3 refills | Status: DC

## 2022-07-15 NOTE — Patient Instructions (Addendum)
Medication Instructions:  Your physician has recommended you make the following change in your medication:   START: Potassium 20 meq daily  *If you need a refill on your cardiac medications before your next appointment, please call your pharmacy*   Lab Work: Your physician recommends that you return for lab work in:   Labs in 3 weeks in [address]: BMP  If you have labs (blood work) drawn today and your tests are completely normal, you will receive your results only by: MyChart Message (if you have MyChart) OR A paper copy in the mail If you have any lab test that is abnormal or we need to change your treatment, we will call you to review the results.   Testing/Procedures: Your physician has requested that you have an echocardiogram in December. Echocardiography is a painless test that uses sound waves to create images of your heart. It provides your doctor with information about the size and shape of your heart and how well your heart's chambers and valves are working. This procedure takes approximately one hour. There are no restrictions for this procedure.    Follow-Up: At [facility], you and your health needs are our priority.  As part of our continuing mission to provide you with exceptional heart care, we have created designated Provider Care Teams.  These Care Teams include your primary Cardiologist (physician) and Advanced Practice Providers (APPs -  Physician Assistants and Nurse Practitioners) who all work together to provide you with the care you need, when you need it.  We recommend signing up for the patient portal called "MyChart".  Sign up information is provided on this After Visit Summary.  MyChart is used to connect with patients for Virtual Visits (Telemedicine).  Patients are able to view lab/test results, encounter notes, upcoming appointments, etc.  Non-urgent messages can be sent to your provider as well.   To learn more about what you can do with MyChart,  go to [email or web address].    Your next appointment:   6 month(s)  The format for your next appointment:   In Person  Provider:   Norman Herrlich, MD    Other Instructions Check home blood pressures and send Dr. Dulce Sellar a list in 2 weeks  Important Information About Sugar         Heart Failure  Weigh yourself every morning when you first wake up and record on a calender or note pad, bring this to your office visits. Using a pill tender can help with taking your medications consistently.  Limit your fluid intake to 2 liters daily  Limit your sodium intake to less than 2-3 grams daily. Ask if you need dietary teaching.  If you gain more than 3 pounds (from your dry weight ), double your dose of diuretic for the day.  If you gain more than 5 pounds (from your dry weight), double your dose of lasix and call your heart failure doctor.  Please do not smoke tobacco since it is very bad for your heart.  Please do not drink alcohol since it can worsen your heart failure.Also avoid OTC nonsteroidal drugs, such as advil, aleve and motrin.  Try to exercise for at least 30 minutes every day because this will help your heart be more efficient. You may be eligible for supervised cardiac rehab, ask your physician.    [facility]  Tips to measure your blood pressure correctly  To determine whether you have hypertension, a medical professional will take a blood pressure reading. How  you prepare for the test, the position of your arm, and other factors can change a blood pressure reading by 10% or more. That could be enough to hide high blood pressure, start you on a drug you don't really need, or lead your doctor to incorrectly adjust your medications. National and international guidelines offer specific instructions for measuring blood pressure. If a doctor, nurse, or medical assistant isn't doing it right, don't hesitate to ask him or her to get with the guidelines. Here's what you can  do to ensure a correct reading:  Don't drink a caffeinated beverage or smoke during the 30 minutes before the test.  Sit quietly for five minutes before the test begins.  During the measurement, sit in a chair with your feet on the floor and your arm supported so your elbow is at about heart level.  The inflatable part of the cuff should completely cover at least 80% of your upper arm, and the cuff should be placed on bare skin, not over a shirt.  Don't talk during the measurement.  Have your blood pressure measured twice, with a brief break in between. If the readings are different by 5 points or more, have it done a third time. There are times to break these rules. If you sometimes feel lightheaded when getting out of bed in the morning or when you stand after sitting, you should have your blood pressure checked while seated and then while standing to see if it falls from one position to the next. Because blood pressure varies throughout the day, your doctor will rarely diagnose hypertension on the basis of a single reading. Instead, he or she will want to confirm the measurements on at least two occasions, usually within a few weeks of one another. The exception to this rule is if you have a blood pressure reading of 180/110 mm Hg or higher. A result this high usually calls for prompt treatment. It's also a good idea to have your blood pressure measured in both arms at least once, since the reading in one arm (usually the right) may be higher than that in the left. A 2014 study in The American Journal of Medicine of nearly 3,400 people found average arm- to-arm differences in systolic blood pressure of about 5 points. The higher number should be used to make treatment decisions. In 2017, new guidelines from the American Heart Association, the [employer] of [employer], and nine other health organizations lowered the diagnosis of high blood pressure to 130/80 mm Hg or higher for all adults. The  guidelines also redefined the various blood pressure categories to now include normal, elevated, Stage 1 hypertension, Stage 2 hypertension, and hypertensive crisis (see "Blood pressure categories"). Blood pressure categories  Blood pressure category SYSTOLIC (upper number)  DIASTOLIC (lower number)  Normal Less than 120 mm Hg and Less than 80 mm Hg  Elevated 120-129 mm Hg and Less than 80 mm Hg  High blood pressure: Stage 1 hypertension 130-139 mm Hg or 80-89 mm Hg  High blood pressure: Stage 2 hypertension 140 mm Hg or higher or 90 mm Hg or higher  Hypertensive crisis (consult your doctor immediately) Higher than 180 mm Hg and/or Higher than 120 mm Hg  Source: American Heart Association and American Stroke Association. For more on getting your blood pressure under control, buy Controlling Your Blood Pressure, a Special Health Report from [facility].  DASH diet: Healthy eating to lower your blood pressure The DASH diet emphasizes portion size,  eating a variety of foods and getting the right amount of nutrients. Discover how DASH can improve your health and lower your blood pressure. By [facility] Staff  DASH stands for Dietary Approaches to Stop Hypertension. The DASH diet is a lifelong approach to healthy eating that's designed to help treat or prevent high blood pressure (hypertension). The DASH diet encourages you to reduce the sodium in your diet and eat a variety of foods rich in nutrients that help lower blood pressure, such as potassium, calcium and magnesium. By following the DASH diet, you may be able to reduce your blood pressure by a few points in just two weeks. Over time, your systolic blood pressure could drop by eight to 14 points, which can make a significant difference in your health risks. Because the DASH diet is a healthy way of eating, it offers health benefits besides just lowering blood pressure. The DASH diet is also in line with dietary recommendations to  prevent osteoporosis, cancer, heart disease, stroke and diabetes. DASH diet: Sodium levels The DASH diet emphasizes vegetables, fruits and low-fat dairy foods -- and moderate amounts of whole grains, fish, poultry and nuts. In addition to the standard DASH diet, there is also a lower sodium version of the diet. You can choose the version of the diet that meets your health needs: Standard DASH diet. You can consume up to 2,300 milligrams (mg) of sodium a day.  Lower sodium DASH diet. You can consume up to 1,500 mg of sodium a day. Both versions of the DASH diet aim to reduce the amount of sodium in your diet compared with what you might get in a typical American diet, which can amount to a whopping 3,400 mg of sodium a day or more. The standard DASH diet meets the recommendation from the Dietary Guidelines for Americans to keep daily sodium intake to less than 2,300 mg a day. The American Heart Association recommends 1,500 mg a day of sodium as an upper limit for all adults. If you aren't sure what sodium level is right for you, talk to your doctor. DASH diet: What to eat Both versions of the DASH diet include lots of whole grains, fruits, vegetables and low-fat dairy products. The DASH diet also includes some fish, poultry and legumes, and encourages a small amount of nuts and seeds a few times a week.  You can eat red meat, sweets and fats in small amounts. The DASH diet is low in saturated fat, cholesterol and total fat. Here's a look at the recommended servings from each food group for the 2,000-calorie-a-day DASH diet. Grains: 6 to 8 servings a day Grains include bread, cereal, rice and pasta. Examples of one serving of grains include 1 slice whole-wheat bread, 1 ounce dry cereal, or 1/2 cup cooked cereal, rice or pasta. Focus on whole grains because they have more fiber and nutrients than do refined grains. For instance, use brown rice instead of white rice, whole-wheat pasta instead of regular  pasta and whole-grain bread instead of white bread. Look for products labeled "100 percent whole grain" or "100 percent whole wheat."  Grains are naturally low in fat. Keep them this way by avoiding butter, cream and cheese sauces. Vegetables: 4 to 5 servings a day Tomatoes, carrots, broccoli, sweet potatoes, greens and other vegetables are full of fiber, vitamins, and such minerals as potassium and magnesium. Examples of one serving include 1 cup raw leafy green vegetables or 1/2 cup cut-up raw or cooked vegetables. Don't think  of vegetables only as side dishes -- a hearty blend of vegetables served over brown rice or whole-wheat noodles can serve as the main dish for a meal.  Fresh and frozen vegetables are both good choices. When buying frozen and canned vegetables, choose those labeled as low sodium or without added salt.  To increase the number of servings you fit in daily, be creative. In a stir-fry, for instance, cut the amount of meat in half and double up on the vegetables. Fruits: 4 to 5 servings a day Many fruits need little preparation to become a healthy part of a meal or snack. Like vegetables, they're packed with fiber, potassium and magnesium and are typically low in fat -- coconuts are an exception. Examples of one serving include one medium fruit, 1/2 cup fresh, frozen or canned fruit, or 4 ounces of juice. Have a piece of fruit with meals and one as a snack, then round out your day with a dessert of fresh fruits topped with a dollop of low-fat yogurt.  Leave on edible peels whenever possible. The peels of apples, pears and most fruits with pits add interesting texture to recipes and contain healthy nutrients and fiber.  Remember that citrus fruits and juices, such as grapefruit, can interact with certain medications, so check with your doctor or pharmacist to see if they're OK for you.  If you choose canned fruit or juice, make sure no sugar is added. Dairy: 2 to 3 servings a  day Milk, yogurt, cheese and other dairy products are major sources of calcium, vitamin D and protein. But the key is to make sure that you choose dairy products that are low fat or fat-free because otherwise they can be a major source of fat -- and most of it is saturated. Examples of one serving include 1 cup skim or 1 percent milk, 1 cup low fat yogurt, or 1 1/2 ounces part-skim cheese. Low-fat or fat-free frozen yogurt can help you boost the amount of dairy products you eat while offering a sweet treat. Add fruit for a healthy twist.  If you have trouble digesting dairy products, choose lactose-free products or consider taking an over-the-counter product that contains the enzyme lactase, which can reduce or prevent the symptoms of lactose intolerance.  Go easy on regular and even fat-free cheeses because they are typically high in sodium. Lean meat, poultry and fish: 6 servings or fewer a day Meat can be a rich source of protein, B vitamins, iron and zinc. Choose lean varieties and aim for no more than 6 ounces a day. Cutting back on your meat portion will allow room for more vegetables. Trim away skin and fat from poultry and meat and then bake, broil, grill or roast instead of frying in fat.  Eat heart-healthy fish, such as salmon, herring and tuna. These types of fish are high in omega-3 fatty acids, which can help lower your total cholesterol. Nuts, seeds and legumes: 4 to 5 servings a week Almonds, sunflower seeds, kidney beans, peas, lentils and other foods in this family are good sources of magnesium, potassium and protein. They're also full of fiber and phytochemicals, which are plant compounds that may protect against some cancers and cardiovascular disease. Serving sizes are small and are intended to be consumed only a few times a week because these foods are high in calories. Examples of one serving include 1/3 cup nuts, 2 tablespoons seeds, or 1/2 cup cooked beans or peas.  Nuts sometimes  get a bad  rap because of their fat content, but they contain healthy types of fat -- monounsaturated fat and omega-3 fatty acids. They're high in calories, however, so eat them in moderation. Try adding them to stir-fries, salads or cereals.  Soybean-based products, such as tofu and tempeh, can be a good alternative to meat because they contain all of the amino acids your body needs to make a complete protein, just like meat. Fats and oils: 2 to 3 servings a day Fat helps your body absorb essential vitamins and helps your body's immune system. But too much fat increases your risk of heart disease, diabetes and obesity. The DASH diet strives for a healthy balance by limiting total fat to less than 30 percent of daily calories from fat, with a focus on the healthier monounsaturated fats. Examples of one serving include 1 teaspoon soft margarine, 1 tablespoon mayonnaise or 2 tablespoons salad dressing. Saturated fat and trans fat are the main dietary culprits in increasing your risk of coronary artery disease. DASH helps keep your daily saturated fat to less than 6 percent of your total calories by limiting use of meat, butter, cheese, whole milk, cream and eggs in your diet, along with foods made from lard, solid shortenings, and palm and coconut oils.  Avoid trans fat, commonly found in such processed foods as crackers, baked goods and fried items.  Read food labels on margarine and salad dressing so that you can choose those that are lowest in saturated fat and free of trans fat. Sweets: 5 servings or fewer a week You don't have to banish sweets entirely while following the DASH diet -- just go easy on them. Examples of one serving include 1 tablespoon sugar, jelly or jam, 1/2 cup sorbet, or 1 cup lemonade. When you eat sweets, choose those that are fat-free or low-fat, such as sorbets, fruit ices, jelly beans, hard candy, graham crackers or low-fat cookies.  Artificial sweeteners such as aspartame  (NutraSweet, Equal) and sucralose (Splenda) may help satisfy your sweet tooth while sparing the sugar. But remember that you still must use them sensibly. It's OK to swap a diet cola for a regular cola, but not in place of a more nutritious beverage such as low-fat milk or even plain water.  Cut back on added sugar, which has no nutritional value but can pack on calories. DASH diet: Alcohol and caffeine Drinking too much alcohol can increase blood pressure. The Dietary Guidelines for Americans recommends that men limit alcohol to no more than two drinks a day and women to one or less. The DASH diet doesn't address caffeine consumption. The influence of caffeine on blood pressure remains unclear. But caffeine can cause your blood pressure to rise at least temporarily. If you already have high blood pressure or if you think caffeine is affecting your blood pressure, talk to your doctor about your caffeine consumption. DASH diet and weight loss While the DASH diet is not a weight-loss program, you may indeed lose unwanted pounds because it can help guide you toward healthier food choices. The DASH diet generally includes about 2,000 calories a day. If you're trying to lose weight, you may need to eat fewer calories. You may also need to adjust your serving goals based on your individual circumstances -- something your health care team can help you decide. Tips to cut back on sodium The foods at the core of the DASH diet are naturally low in sodium. So just by following the DASH diet, you're likely to reduce  your sodium intake. You also reduce sodium further by: Using sodium-free spices or flavorings with your food instead of salt  Not adding salt when cooking rice, pasta or hot cereal  Rinsing canned foods to remove some of the sodium  Buying foods labeled "no salt added," "sodium-free," "low sodium" or "very low sodium" One teaspoon of table salt has 2,325 mg of sodium. When you read food labels, you may  be surprised at just how much sodium some processed foods contain. Even low-fat soups, canned vegetables, ready-to-eat cereals and sliced [place] from the local deli -- foods you may have considered healthy -- often have lots of sodium. You may notice a difference in taste when you choose low-sodium food and beverages. If things seem too bland, gradually introduce low-sodium foods and cut back on table salt until you reach your sodium goal. That'll give your palate time to adjust. Using salt-free seasoning blends or herbs and spices may also ease the transition. It can take several weeks for your taste buds to get used to less salty foods. Putting the pieces of the DASH diet together Try these strategies to get started on the DASH diet:  Change gradually. If you now eat only one or two servings of fruits or vegetables a day, try to add a serving at lunch and one at dinner. Rather than switching to all whole grains, start by making one or two of your grain servings whole grains. Increasing fruits, vegetables and whole grains gradually can also help prevent bloating or diarrhea that may occur if you aren't used to eating a diet with lots of fiber. You can also try over-the-counter products to help reduce gas from beans and vegetables.  Reward successes and forgive slip-ups. Reward yourself with a nonfood treat for your accomplishments -- rent a movie, purchase a book or get together with a friend. Everyone slips, especially when learning something new. Remember that changing your lifestyle is a long-term process. Find out what triggered your setback and then just pick up where you left off with the DASH diet.  Add physical activity. To boost your blood pressure lowering efforts even more, consider increasing your physical activity in addition to following the DASH diet. Combining both the DASH diet and physical activity makes it more likely that you'll reduce your blood pressure.  Get support if you need it. If  you're having trouble sticking to your diet, talk to your doctor or dietitian about it. You might get some tips that will help you stick to the DASH diet. Remember, healthy eating isn't an all-or-nothing proposition. What's most important is that, on average, you eat healthier foods with plenty of variety -- both to keep your diet nutritious and to avoid boredom or extremes. And with the DASH diet, you can have both.

## 2022-07-15 NOTE — Addendum Note (Signed)
Addended by: Roxanne Mins I on: 07/15/2022 10:09 AM   Modules accepted: Orders

## 2022-07-18 ENCOUNTER — Encounter: Payer: Self-pay | Admitting: Family Medicine

## 2022-07-25 ENCOUNTER — Ambulatory Visit (HOSPITAL_BASED_OUTPATIENT_CLINIC_OR_DEPARTMENT_OTHER): Admission: RE | Admit: 2022-07-25 | Payer: Commercial Managed Care - HMO | Source: Ambulatory Visit

## 2022-08-27 ENCOUNTER — Telehealth: Payer: Self-pay | Admitting: Family Medicine

## 2022-08-27 DIAGNOSIS — I5043 Acute on chronic combined systolic (congestive) and diastolic (congestive) heart failure: Secondary | ICD-10-CM

## 2022-09-01 NOTE — Telephone Encounter (Signed)
Pt is calling to check on the status of medication refill. Pt reports that he he has been out of medication for 6 days. Please advise Cb- [phone]

## 2022-09-01 NOTE — Telephone Encounter (Signed)
[place] called and spoke to Germania, [facility] about the refill(s) furosemide requested. Advised it was sent on 08/27/22 #180/0 refill(s). She says it's on file, too early to refill because he received a 90 day supply towards the end of August, so he should have enough to last until the end of November. Patient called  and advised, he verbalized understanding. He says he will look for the bottle when he gets home and call back if he can't find it. I advised to call the pharmacy to ask if it will be a cost and that he has a refill on file if he has to purchase it.

## 2022-09-15 ENCOUNTER — Ambulatory Visit: Payer: Commercial Managed Care - HMO | Attending: Family Medicine | Admitting: Family Medicine

## 2022-09-15 VITALS — BP 112/64 | HR 68 | Temp 99.1°F | Wt 227.0 lb

## 2022-09-15 DIAGNOSIS — E782 Mixed hyperlipidemia: Secondary | ICD-10-CM

## 2022-09-15 DIAGNOSIS — I428 Other cardiomyopathies: Secondary | ICD-10-CM | POA: Diagnosis not present

## 2022-09-15 DIAGNOSIS — I5043 Acute on chronic combined systolic (congestive) and diastolic (congestive) heart failure: Secondary | ICD-10-CM | POA: Diagnosis not present

## 2022-09-15 DIAGNOSIS — I10 Essential (primary) hypertension: Secondary | ICD-10-CM

## 2022-09-15 DIAGNOSIS — E1169 Type 2 diabetes mellitus with other specified complication: Secondary | ICD-10-CM | POA: Diagnosis not present

## 2022-09-15 LAB — POCT GLYCOSYLATED HEMOGLOBIN (HGB A1C): HbA1c, POC (controlled diabetic range): 5.8 % (ref 0.0–7.0)

## 2022-09-15 LAB — GLUCOSE, POCT (MANUAL RESULT ENTRY): POC Glucose: 123 mg/dl — AB (ref 70–99)

## 2022-09-15 MED ORDER — METOPROLOL TARTRATE 25 MG PO TABS: ORAL_TABLET | ORAL | 1 refills | Status: DC

## 2022-09-15 MED ORDER — DAPAGLIFLOZIN PROPANEDIOL 5 MG PO TABS: 5.0000 mg | ORAL_TABLET | Freq: Every day | ORAL | 6 refills | Status: DC

## 2022-09-15 MED ORDER — FUROSEMIDE 40 MG PO TABS: 40.0000 mg | ORAL_TABLET | Freq: Every day | ORAL | 1 refills | Status: DC

## 2022-09-15 MED ORDER — HYDRALAZINE HCL 50 MG PO TABS: 50.0000 mg | ORAL_TABLET | Freq: Three times a day (TID) | ORAL | 1 refills | Status: DC

## 2022-09-15 MED ORDER — ATORVASTATIN CALCIUM 20 MG PO TABS: ORAL_TABLET | ORAL | 1 refills | Status: DC

## 2022-09-15 MED ORDER — ISOSORBIDE MONONITRATE ER 30 MG PO TB24: 60.0000 mg | ORAL_TABLET | Freq: Every day | ORAL | 1 refills | Status: DC

## 2022-09-15 MED ORDER — SPIRONOLACTONE 25 MG PO TABS: 25.0000 mg | ORAL_TABLET | Freq: Every day | ORAL | 1 refills | Status: DC

## 2022-09-15 NOTE — Patient Instructions (Addendum)
Please call to schedule your Colonoscopy as the GI office has been trying to contact you.  [facility] [address] [place], [place] [address] PH# [phone]

## 2022-09-15 NOTE — Progress Notes (Unsigned)
Subjective:  Patient ID: Jim Garcia, male    DOB: 02/23/1966  Age: 56 y.o. MRN: [id number]  CC: Diabetes   HPI Jim Garcia is a 56 y.o. year old male with a history of hypertension, nonischemic cardiomyopathy (EF 35 to 40% in 11/2018), Type II DM (A1c 5.8) who presents today for a follow up visit.   Interval History:  Had a cardiology visit 2 months ago.  He cut back down on excessive eating starches and sweets and his A1c is 5.8 down from 6.1. He exercises by means of mowing the yard.  He had a positive Cologuard Past Medical History:  Diagnosis Date   Acute on chronic combined systolic and diastolic CHF, NYHA class 3 ([place]) 04/14/2018   Acute on chronic systolic heart failure ([place]) 01/26/2022   CHF (congestive heart failure) ([place])    Edema 06/12/2015   Essential hypertension 04/15/2018   Hyperlipidemia 06/12/2015   Hypokalemia    Morbid obesity due to excess calories ([place]) 06/13/2015   Non-ischemic cardiomyopathy ([place]) 04/15/2018   Obesity (BMI 30.0-34.9) 11/24/2018   Sleep apnea, obstructive 04/15/2018   Stage 2 hypertension 01/26/2022   Syncopal episodes 01/26/2022   Type 2 diabetes mellitus without complication ([place]) 54/54/4920    Past Surgical History:  Procedure Laterality Date   NO PAST SURGERIES      Family History  Problem Relation Age of Onset   Diabetes Mother    Hypertension Mother    Diabetes Father     Social History   Socioeconomic History   Marital status: Single    Spouse name: Not on file   Number of children: 2   Years of education: Not on file   Highest education level: High school graduate  Occupational History   Not on file  Tobacco Use   Smoking status: Former   Smokeless tobacco: Never   Tobacco comments:    quit 15 years ago  Vaping Use   Vaping Use: Never used  Substance and Sexual Activity   Alcohol use: Yes    Comment: occ   Drug use: Yes    Types: Marijuana    Comment: 04/08/21 almost daily   Sexual activity: Not on file  Other  Topics Concern   Not on file  Social History Narrative   Not on file   Social Determinants of Health   Financial Resource Strain: Not on file  Food Insecurity: Not on file  Transportation Needs: Not on file  Physical Activity: Not on file  Stress: Not on file  Social Connections: Not on file    No Known Allergies  Outpatient Medications Prior to Visit  Medication Sig Dispense Refill   atorvastatin (LIPITOR) 20 MG tablet TAKE 1 TABLET(20 MG) BY MOUTH DAILY 90 tablet 1   Blood Glucose Monitoring Suppl (TRUE METRIX METER) w/Device KIT 1 Device by Does not apply route 3 (three) times daily. (Patient not taking: Reported on 07/15/2022) 1 kit 0   dapagliflozin propanediol (FARXIGA) 5 MG TABS tablet Take 1 tablet (5 mg total) by mouth daily before breakfast. 30 tablet 6   furosemide (LASIX) 40 MG tablet TAKE 1 TABLET(40 MG) BY MOUTH TWICE DAILY 180 tablet 0   glucose blood (TRUE METRIX BLOOD GLUCOSE TEST) test strip Use as instructed (Patient not taking: Reported on 07/15/2022) 100 each 12   hydrALAZINE (APRESOLINE) 50 MG tablet Take 1 tablet (50 mg total) by mouth 3 (three) times daily. 270 tablet 1   isosorbide mononitrate (IMDUR) 30 MG 24 hr tablet  Take 2 tablets (60 mg total) by mouth daily. 180 tablet 1   metoprolol tartrate (LOPRESSOR) 25 MG tablet TAKE 1/2 TABLET(12.5 MG) BY MOUTH TWICE DAILY 90 tablet 1   potassium chloride SA (KLOR-CON M) 20 MEQ tablet Take 1 tablet (20 mEq total) by mouth daily. 90 tablet 3   predniSONE (DELTASONE) 20 MG tablet Take 1 tablet (20 mg total) by mouth daily with breakfast. 5 tablet 0   sacubitril-valsartan (ENTRESTO) 97-103 MG Take 1 tablet by mouth 2 (two) times daily. 180 tablet 3   spironolactone (ALDACTONE) 25 MG tablet Take 1 tablet (25 mg total) by mouth daily. 90 tablet 1   TRUEPLUS SAFETY LANCETS 28G MISC 1 Stick by Does not apply route 3 (three) times daily. (Patient not taking: Reported on 07/15/2022) 100 each 3   No facility-administered  medications prior to visit.     ROS Review of Systems *** Objective:  BP 112/64   Pulse 68   Temp 99.1 F (37.3 C) (Oral)   Wt 227 lb (103 kg)   SpO2 99%   BMI 28.37 kg/m      09/15/2022    3:34 PM 07/15/2022    9:46 AM 07/15/2022    9:38 AM  BP/Weight  Systolic BP 509 326 712  Diastolic BP 64 80 80  Wt. (Lbs) 227    BMI 28.37 kg/m2        Physical Exam ***    Latest Ref Rng & Units 06/04/2022   10:38 AM 03/30/2021    3:23 PM 03/30/2021    3:13 PM  CMP  Glucose 70 - 99 mg/dL 93  117  122   BUN 6 - 24 mg/dL _0 Creatinine 0.76 - 1.27 mg/dL 0.98  1.00  1.14   Sodium 134 - 144 mmol/L 142  143  142   Potassium 3.5 - 5.2 mmol/L 3.4  3.9  4.0   Chloride 96 - 106 mmol/L 101  107  109   CO2 20 - 29 mmol/L 22   20   Calcium 8.7 - 10.2 mg/dL 9.4   9.3   Total Protein 6.5 - 8.1 g/dL   7.1   Total Bilirubin 0.3 - 1.2 mg/dL   0.6   Alkaline Phos 38 - 126 U/L   66   AST 15 - 41 U/L   18   ALT 0 - 44 U/L   13     Lipid Panel     Component Value Date/Time   CHOL 137 06/04/2022 1038   TRIG 47 06/04/2022 1038   HDL 48 06/04/2022 1038   CHOLHDL 3.6 06/15/2019 0917   LDLCALC 78 06/04/2022 1038    CBC    Component Value Date/Time   WBC 8.0 03/30/2021 1513   RBC 5.09 03/30/2021 1513   HGB 15.3 03/30/2021 1523   HCT 45.0 03/30/2021 1523   PLT 220 03/30/2021 1513   MCV 91.2 03/30/2021 1513   MCH 29.9 03/30/2021 1513   MCHC 32.8 03/30/2021 1513   RDW 14.0 03/30/2021 1513   LYMPHSABS 1.9 03/30/2021 1513   MONOABS 0.5 03/30/2021 1513   EOSABS 0.0 03/30/2021 1513   BASOSABS 0.0 03/30/2021 1513    Lab Results  Component Value Date   HGBA1C 5.8 09/15/2022    Assessment & Plan:  1. Type 2 diabetes mellitus with other specified complication, without long-term current use of insulin (HCC) *** - POCT glucose (manual entry) - POCT glycosylated hemoglobin (Hb A1C)  Health Care Maintenance: *** No orders of the defined types were placed in this  encounter.   Follow-up: No follow-ups on file.       Charlott Rakes, MD, FAAFP. [facility] and [place], [place]   09/15/2022, 3:41 PM

## 2022-09-16 ENCOUNTER — Encounter: Payer: Self-pay | Admitting: Family Medicine

## 2022-09-16 LAB — CMP14+EGFR
ALT: 8 IU/L (ref 0–44)
AST: 8 IU/L (ref 0–40)
Albumin/Globulin Ratio: 1.9 (ref 1.2–2.2)
Albumin: 4.5 g/dL (ref 3.8–4.9)
Alkaline Phosphatase: 85 IU/L (ref 44–121)
BUN/Creatinine Ratio: 11 (ref 9–20)
BUN: 13 mg/dL (ref 6–24)
Bilirubin Total: 0.3 mg/dL (ref 0.0–1.2)
CO2: 25 mmol/L (ref 20–29)
Calcium: 9.3 mg/dL (ref 8.7–10.2)
Chloride: 102 mmol/L (ref 96–106)
Creatinine, Ser: 1.14 mg/dL (ref 0.76–1.27)
Globulin, Total: 2.4 g/dL (ref 1.5–4.5)
Glucose: 94 mg/dL (ref 70–99)
Potassium: 4.2 mmol/L (ref 3.5–5.2)
Sodium: 142 mmol/L (ref 134–144)
Total Protein: 6.9 g/dL (ref 6.0–8.5)
eGFR: 76 mL/min/{1.73_m2} (ref >59)

## 2022-12-09 ENCOUNTER — Ambulatory Visit: Payer: 59 | Admitting: Family Medicine

## 2023-01-12 NOTE — Progress Notes (Unsigned)
Cardiology Office Note:    Date:  01/13/2023   ID:  Jim Garcia, DOB 12-10-1965, MRN [id number]  PCP:  Jim Rakes, MD  Cardiologist:  Jim More, MD    Referring MD: Jim Rakes, MD    ASSESSMENT:    1. Hypertensive heart disease with heart failure ([place])   2. Dilated cardiomyopathy ([place])   3. Hypokalemia    PLAN:    In order of problems listed above:  ***   Next appointment: ***   Medication Adjustments/Labs and Tests Ordered: Current medicines are reviewed at length with the patient today.  Concerns regarding medicines are outlined above.  No orders of the defined types were placed in this encounter.  No orders of the defined types were placed in this encounter.   No chief complaint on file.   History of Present Illness:    Jim Garcia is a 57 y.o. male with a hx of hypertensive heart disease with heart failure dilated cardiomyopathy type 2 diabetes and hypokalemia last seen 07/15/2022. Compliance with diet, lifestyle and medications: *** Past Medical History:  Diagnosis Date   Acute on chronic combined systolic and diastolic CHF, NYHA class 3 ([place]) 04/14/2018   Acute on chronic systolic heart failure ([place]) 01/26/2022   CHF (congestive heart failure) ([place])    Edema 06/12/2015   Essential hypertension 04/15/2018   Hyperlipidemia 06/12/2015   Hypokalemia    Morbid obesity due to excess calories ([place]) 06/13/2015   Non-ischemic cardiomyopathy ([place]) 04/15/2018   Obesity (BMI 30.0-34.9) 11/24/2018   Sleep apnea, obstructive 04/15/2018   Stage 2 hypertension 01/26/2022   Syncopal episodes 01/26/2022   Type 2 diabetes mellitus without complication ([place]) [id number]    Past Surgical History:  Procedure Laterality Date   NO PAST SURGERIES      Current Medications: No outpatient medications have been marked as taking for the 01/13/23 encounter (Appointment) with Jim Priest, MD.     Allergies:   Patient has no known allergies.   Social History    Socioeconomic History   Marital status: Single    Spouse name: Not on file   Number of children: 2   Years of education: Not on file   Highest education level: High school graduate  Occupational History   Not on file  Tobacco Use   Smoking status: Former   Smokeless tobacco: Never   Tobacco comments:    quit 15 years ago  Vaping Use   Vaping Use: Never used  Substance and Sexual Activity   Alcohol use: Yes    Comment: occ   Drug use: Yes    Types: Marijuana    Comment: 04/08/21 almost daily   Sexual activity: Not on file  Other Topics Concern   Not on file  Social History Narrative   Not on file   Social Determinants of Health   Financial Resource Strain: Not on file  Food Insecurity: Not on file  Transportation Needs: Not on file  Physical Activity: Not on file  Stress: Not on file  Social Connections: Not on file     Family History: The patient's ***family history includes Diabetes in his father and mother; Hypertension in his mother. ROS:   Please see the history of present illness.    All other systems reviewed and are negative.  EKGs/Labs/Other Studies Reviewed:    The following studies were reviewed today:  Cardiac Studies & Procedures       ECHOCARDIOGRAM  ECHOCARDIOGRAM COMPLETE 11/05/2018  Narrative [facility]*  [place], [place] [address] [phone]  ------------------------------------------------------------------- Transthoracic Echocardiography  Patient:    Jim, Garcia MR #:       [id number] Study Date: 11/05/2018 Gender:     M Age:        47 Height:     190.5 cm Weight:     113.9 kg BSA:        2.48 m^2 Pt. Status: Room:  SONOGRAPHER  Jim Garcia, [facility] PERFORMING   [facility] [place] ATTENDING    Jim [place], MD ORDERING     Jim Curt Bears, MD [place], MD  cc:  ------------------------------------------------------------------- LV EF: 35% -    40%  ------------------------------------------------------------------- History:   PMH:  Non-ischemic cardiomyopathy. Hypertension. CHF. Edema. Hyperlipidemia. Morbidly obese. Diabetes mellitus.  ------------------------------------------------------------------- Study Conclusions  - Left ventricle: The cavity size was mildly dilated. Wall thickness was increased in a pattern of moderate LVH. Systolic function was moderately reduced. The estimated ejection fraction was in the range of 35% to 40%. Diffuse hypokinesis. Features are consistent with a pseudonormal left ventricular filling pattern, with concomitant abnormal relaxation and increased filling pressure (grade 2 diastolic dysfunction). - Pericardium, extracardiac: A trivial pericardial effusion was identified posterior to the heart.  ------------------------------------------------------------------- Study data:  Comparison was made to the study of 05/14/2018.  Study status:  Routine.  Procedure:  The patient reported no pain pre or post test. Transthoracic echocardiography. Image quality was adequate.          Transthoracic echocardiography.  M-mode, complete 2D, spectral Doppler, and color Doppler.  Birthdate: Patient birthdate: October 23, 1966.  Age:  Patient is 57 yr old.  Sex: Gender: male.    BMI: 31.4 kg/m^2.  Blood pressure:     152/75 Patient status:  Outpatient.  Study date:  Study date: 11/05/2018. Study time: 10:04 AM.  Location:  Echo laboratory.  -------------------------------------------------------------------  ------------------------------------------------------------------- Left ventricle:  The cavity size was mildly dilated. Wall thickness was increased in a pattern of moderate LVH. Systolic function was moderately reduced. The estimated ejection fraction was in the range of 35% to 40%. Diffuse hypokinesis. Features are consistent with a pseudonormal left ventricular filling pattern, with concomitant  abnormal relaxation and increased filling pressure (grade 2 diastolic dysfunction).  ------------------------------------------------------------------- Aortic valve:   Structurally normal valve. Trileaflet; normal thickness leaflets. Cusp separation was normal. Mobility was not restricted.  Doppler:  Transvalvular velocity was within the normal range. There was no stenosis. There was no regurgitation.  ------------------------------------------------------------------- Aorta:  The aorta was normal, not dilated, and non-diseased. Aortic root: The aortic root was normal in size.  ------------------------------------------------------------------- Mitral valve:   Structurally normal valve.   Mobility was not restricted.  Doppler:  Transvalvular velocity was within the normal range. There was no evidence for stenosis. There was trivial regurgitation.    Indexed valve area by pressure half-time: 0.94 cm^2/m^2.  ------------------------------------------------------------------- Left atrium:  The atrium was normal in size.  ------------------------------------------------------------------- Atrial septum:  The septum was normal.  ------------------------------------------------------------------- Right ventricle:  The cavity size was normal. Wall thickness was normal. Systolic function was normal.  ------------------------------------------------------------------- Pulmonic valve:    Structurally normal valve.   Cusp separation was normal.  Doppler:  Transvalvular velocity was within the normal range. There was no evidence for stenosis. There was no regurgitation.  ------------------------------------------------------------------- Tricuspid valve:   Structurally normal valve.    Doppler: Transvalvular velocity was within the normal range. There was no regurgitation.  ------------------------------------------------------------------- Pulmonary artery:   Poorly visualized.  The  main pulmonary artery was normal-sized. Systolic pressure was within the normal range.  ------------------------------------------------------------------- Right atrium:  The atrium was normal in size.  ------------------------------------------------------------------- Pericardium:  A trivial pericardial effusion was identified posterior to the heart.  ------------------------------------------------------------------- Systemic veins: Inferior vena cava: The vessel was normal in size. The respirophasic diameter changes were in the normal range (= 50%), consistent with normal central venous pressure.  ------------------------------------------------------------------- Post procedure conclusions Ascending Aorta:  - The aorta was normal, not dilated, and non-diseased.  ------------------------------------------------------------------- Measurements  Left ventricle                           Value          Reference LV ID, ED, PLAX chordal          (H)     60.7  mm       43 - 52 LV ID, ES, PLAX chordal          (H)     45.1  mm       23 - 38 LV fx shortening, PLAX chordal   (L)     26    %        >=29 LV PW thickness, ED                      15    mm       ---------- IVS/LV PW ratio, ED                      0.94           <=1.3 LV ejection fraction, 1-p A4C            45    %        ---------- LV end-diastolic volume, 2-p             203   ml       ---------- LV end-systolic volume, 2-p              100   ml       ---------- LV ejection fraction, 2-p                51    %        ---------- Stroke volume, 2-p                       103   ml       ---------- LV end-diastolic volume/bsa, 2-p         82    ml/m^2   ---------- LV end-systolic volume/bsa, 2-p          40    ml/m^2   ---------- Stroke volume/bsa, 2-p                   41.5  ml/m^2   ---------- LV e&', lateral                           5.93  cm/s     ---------- LV E/e&', lateral                         8.74            ---------- LV e&', medial  5.99  cm/s     ---------- LV E/e&', medial                          8.65           ---------- LV e&', average                           5.96  cm/s     ---------- LV E/e&', average                         8.69           ----------  Ventricular septum                       Value          Reference IVS thickness, ED                        14.1  mm       ----------  LVOT                                     Value          Reference LVOT ID, S                               24    mm       ---------- LVOT area                                4.52  cm^2     ----------  Aorta                                    Value          Reference Aortic root ID, ED                       36    mm       ----------  Left atrium                              Value          Reference LA ID, A-P, ES                           41    mm       ---------- LA ID/bsa, A-P                           1.65  cm/m^2   <=2.2 LA volume, S                             75.6  ml       ---------- LA volume/bsa, S  30.5  ml/m^2   ---------- LA volume, ES, 1-p A4C                   71.7  ml       ---------- LA volume/bsa, ES, 1-p A4C               28.9  ml/m^2   ---------- LA volume, ES, 1-p A2C                   76.4  ml       ---------- LA volume/bsa, ES, 1-p A2C               30.8  ml/m^2   ----------  Mitral valve                             Value          Reference Mitral E-wave peak velocity              51.8  cm/s     ---------- Mitral A-wave peak velocity              51.8  cm/s     ---------- Mitral deceleration time         (H)     320   ms       150 - 230 Mitral pressure half-time                94    ms       ---------- Mitral E/A ratio, peak                   1              ---------- Mitral valve area/bsa, PHT, DP           0.94  cm^2/m^2 ----------  Right atrium                             Value          Reference RA ID, S-I, ES, A4C               (H)     54.2  mm       34 - 49 RA area, ES, A4C                 (H)     22.3  cm^2     8.3 - 19.5 RA volume, ES, A/L                       72.1  ml       ---------- RA volume/bsa, ES, A/L                   29    ml/m^2   ----------  Systemic veins                           Value          Reference Estimated CVP                            3     mm Hg    ----------  Right ventricle  Value          Reference RV ID, minor axis, ED, A4C base          38.1  mm       ---------- TAPSE                                    31.5  mm       ---------- RV s&', lateral, S                        10.3  cm/s     ----------  Legend: (L)  and  (H)  mark values outside specified reference range.  ------------------------------------------------------------------- Prepared and Electronically Authenticated by  Jim More, MD 2020-01-03T12:05:00             EKG:  EKG ordered today and personally reviewed.  The ekg ordered today demonstrates ***  Recent Labs: 09/15/2022: ALT 8; BUN 13; Creatinine, Ser 1.14; Potassium 4.2; Sodium 142  Recent Lipid Panel    Component Value Date/Time   CHOL 137 06/04/2022 1038   TRIG 47 06/04/2022 1038   HDL 48 06/04/2022 1038   CHOLHDL 3.6 06/15/2019 0917   LDLCALC 78 06/04/2022 1038    Physical Exam:    VS:  There were no vitals taken for this visit.    Wt Readings from Last 3 Encounters:  09/15/22 227 lb (103 kg)  07/15/22 232 lb (105.2 kg)  06/04/22 238 lb (108 kg)     GEN: *** Well nourished, well developed in no acute distress HEENT: Normal NECK: No JVD; No carotid bruits LYMPHATICS: No lymphadenopathy CARDIAC: ***RRR, no murmurs, rubs, gallops RESPIRATORY:  Clear to auscultation without rales, wheezing or rhonchi  ABDOMEN: Soft, non-tender, non-distended MUSCULOSKELETAL:  No edema; No deformity  SKIN: Warm and dry NEUROLOGIC:  Alert and oriented x 3 PSYCHIATRIC:  Normal affect    Signed, Jim More, MD  01/13/2023  7:21 AM    [place]

## 2023-01-13 ENCOUNTER — Encounter: Payer: Self-pay | Admitting: Cardiology

## 2023-01-13 ENCOUNTER — Ambulatory Visit: Payer: BLUE CROSS/BLUE SHIELD | Admitting: Cardiology

## 2023-01-14 NOTE — Progress Notes (Unsigned)
Visit canceled

## 2023-01-14 NOTE — Patient Instructions (Signed)
visit canceled

## 2023-01-16 ENCOUNTER — Other Ambulatory Visit: Payer: Self-pay | Admitting: Family Medicine

## 2023-01-16 DIAGNOSIS — I5043 Acute on chronic combined systolic (congestive) and diastolic (congestive) heart failure: Secondary | ICD-10-CM

## 2023-02-02 ENCOUNTER — Ambulatory Visit: Payer: BLUE CROSS/BLUE SHIELD | Admitting: Cardiology

## 2023-03-12 ENCOUNTER — Other Ambulatory Visit: Payer: Self-pay | Admitting: Family Medicine

## 2023-03-12 DIAGNOSIS — I428 Other cardiomyopathies: Secondary | ICD-10-CM

## 2023-03-12 DIAGNOSIS — I5043 Acute on chronic combined systolic (congestive) and diastolic (congestive) heart failure: Secondary | ICD-10-CM

## 2023-03-12 DIAGNOSIS — I10 Essential (primary) hypertension: Secondary | ICD-10-CM

## 2023-03-12 NOTE — Telephone Encounter (Signed)
Requested Prescriptions  Pending Prescriptions Disp Refills   spironolactone (ALDACTONE) 25 MG tablet [Pharmacy Med Name: SPIRONOLACTONE 25MG  TABLETS] 90 tablet 0    Sig: TAKE 1 TABLET(25 MG) BY MOUTH DAILY     Cardiovascular: Diuretics - Aldosterone Antagonist Passed - 03/12/2023  7:11 AM      Passed - Cr in normal range and within 180 days    Creatinine, Ser  Date Value Ref Range Status  09/15/2022 1.14 0.76 - 1.27 mg/dL Final         Passed - K in normal range and within 180 days    Potassium  Date Value Ref Range Status  09/15/2022 4.2 3.5 - 5.2 mmol/L Final         Passed - Na in normal range and within 180 days    Sodium  Date Value Ref Range Status  09/15/2022 142 134 - 144 mmol/L Final         Passed - eGFR is 30 or above and within 180 days    GFR calc Af Amer  Date Value Ref Range Status  06/15/2019 74 >59 mL/min/1.73 Final   GFR, Estimated  Date Value Ref Range Status  03/30/2021 >60 >60 mL/min Final    Comment:    (NOTE) Calculated using the CKD-EPI Creatinine Equation (2021)    eGFR  Date Value Ref Range Status  09/15/2022 76 >59 mL/min/1.73 Final         Passed - Last BP in normal range    BP Readings from Last 1 Encounters:  09/15/22 112/64         Passed - Valid encounter within last 6 months    Recent Outpatient Visits           5 months ago Type 2 diabetes mellitus with other specified complication, without long-term current use of insulin (HCC)   [facility] [place], [place], MD   9 months ago Type 2 diabetes mellitus with other specified complication, without long-term current use of insulin (HCC)   [place] [place], Odette Horns, MD   1 year ago Type 2 diabetes mellitus without complication, without long-term current use of insulin [facility])   [place] [facility] [place], [place], [place]   3 years ago Type 2 diabetes mellitus with other circulatory  complication, without long-term current use of insulin (HCC)   [place] [facility] [place], [place], MD   3 years ago Screening for colon cancer   [facility] [place], Odette Horns, MD       Future Appointments             In 5 days Hoy Register, MD [facility]             hydrALAZINE (APRESOLINE) 50 MG tablet [Pharmacy Med Name: HYDRALAZINE  50MG  TABLETS(ORANGE)] 270 tablet 0    Sig: TAKE 1 TABLET(50 MG) BY MOUTH THREE TIMES DAILY     Cardiovascular:  Vasodilators Failed - 03/12/2023  7:11 AM      Failed - HCT in normal range and within 360 days    HCT  Date Value Ref Range Status  03/30/2021 45.0 39.0 - 52.0 % Final         Failed - HGB in normal range and within 360 days    Hemoglobin  Date Value Ref Range Status  03/30/2021 15.3 13.0 - 17.0 g/dL  Final         Failed - RBC in normal range and within 360 days    RBC  Date Value Ref Range Status  03/30/2021 5.09 4.22 - 5.81 MIL/uL Final         Failed - WBC in normal range and within 360 days    WBC  Date Value Ref Range Status  03/30/2021 8.0 4.0 - 10.5 K/uL Final         Failed - PLT in normal range and within 360 days    Platelets  Date Value Ref Range Status  03/30/2021 220 150 - 400 K/uL Final         Failed - ANA Screen, Ifa, Serum in normal range and within 360 days    No results found for: "ANA", "ANATITER", "LABANTI"       Passed - Last BP in normal range    BP Readings from Last 1 Encounters:  09/15/22 112/64         Passed - Valid encounter within last 12 months    Recent Outpatient Visits           5 months ago Type 2 diabetes mellitus with other specified complication, without long-term current use of insulin (HCC)   [place] [facility] [place], [place], [place]   9 months ago Type 2 diabetes mellitus with other specified complication, without long-term current use of insulin  (HCC)   [place] [facility] [place], [place], MD   1 year ago Type 2 diabetes mellitus without complication, without long-term current use of insulin [facility])   [place] [facility] [place], [place], [place]   3 years ago Type 2 diabetes mellitus with other circulatory complication, without long-term current use of insulin (HCC)   [place] [facility] [place], Odette Horns, MD   3 years ago Screening for colon cancer   [place] [facility] Hoy Register, MD       Future Appointments             In 5 days Hoy Register, MD [facility]             isosorbide mononitrate (IMDUR) 30 MG 24 hr tablet [Pharmacy Med Name: ISOSORBIDE MONONITRATE 30MG  ER TABS] 180 tablet 0    Sig: TAKE 2 TABLETS(60 MG) BY MOUTH DAILY     Cardiovascular:  Nitrates Passed - 03/12/2023  7:11 AM      Passed - Last BP in normal range    BP Readings from Last 1 Encounters:  09/15/22 112/64         Passed - Last Heart Rate in normal range    Pulse Readings from Last 1 Encounters:  09/15/22 68         Passed - Valid encounter within last 12 months    Recent Outpatient Visits           5 months ago Type 2 diabetes mellitus with other specified complication, without long-term current use of insulin (HCC)   [place] [place], [place], MD   9 months ago Type 2 diabetes mellitus with other specified complication, without long-term current use of insulin (HCC)   [place] [facility] [place], Odette Horns, MD   1 year ago Type 2 diabetes mellitus without complication, without long-term current use of insulin (HCC)   [place]  [facility] [place], [place], [place]   3 years ago Type 2 diabetes mellitus with other circulatory complication, without long-term current use of insulin [facility])   [place]  [facility] Hoy Register, MD   3 years ago Screening for colon cancer   [place] [facility] Hoy Register, MD       Future Appointments             In 5 days Hoy Register, MD [facility]

## 2023-03-17 ENCOUNTER — Ambulatory Visit: Payer: Commercial Managed Care - HMO | Admitting: Family Medicine

## 2023-03-29 ENCOUNTER — Other Ambulatory Visit: Payer: Self-pay | Admitting: Family Medicine

## 2023-03-29 DIAGNOSIS — E782 Mixed hyperlipidemia: Secondary | ICD-10-CM

## 2023-03-29 DIAGNOSIS — I428 Other cardiomyopathies: Secondary | ICD-10-CM

## 2023-03-31 NOTE — Telephone Encounter (Signed)
Requested Prescriptions  Pending Prescriptions Disp Refills   atorvastatin (LIPITOR) 20 MG tablet [Pharmacy Med Name: ATORVASTATIN 20MG  TABLETS] 90 tablet 0    Sig: TAKE 1 TABLET(20 MG) BY MOUTH DAILY     Cardiovascular:  Antilipid - Statins Failed - 03/29/2023  3:16 AM      Failed - Lipid Panel in normal range within the last 12 months    Cholesterol, Total  Date Value Ref Range Status  06/04/2022 137 100 - 199 mg/dL Final   LDL Chol Calc (NIH)  Date Value Ref Range Status  06/04/2022 78 0 - 99 mg/dL Final   HDL  Date Value Ref Range Status  06/04/2022 48 >39 mg/dL Final   Triglycerides  Date Value Ref Range Status  06/04/2022 47 0 - 149 mg/dL Final         Passed - Patient is not pregnant      Passed - Valid encounter within last 12 months    Recent Outpatient Visits           6 months ago Type 2 diabetes mellitus with other specified complication, without long-term current use of insulin (HCC)   [place] [facility] [place], [place], MD   10 months ago Type 2 diabetes mellitus with other specified complication, without long-term current use of insulin (HCC)   [place] [facility] [place], [place], MD   1 year ago Type 2 diabetes mellitus without complication, without long-term current use of insulin [facility])   [place] [facility] [place], [place], [place]   3 years ago Type 2 diabetes mellitus with other circulatory complication, without long-term current use of insulin (HCC)   [place] [facility] Hoy Register, MD   3 years ago Screening for colon cancer   [place] [facility] Hoy Register, MD

## 2023-04-17 IMAGING — CT CT HEAD CODE STROKE
4 series · 16 of 47 positions shown, 18 images · non-contrast
Comparison: None.

CLINICAL DATA: Code stroke.  Neuro deficit, acute stroke suspected.

EXAM:
CT HEAD WITHOUT CONTRAST
TECHNIQUE: Contiguous axial images were obtained from the base of the skull
through the vertex without intravenous contrast.

[Series 2: head 5.0 st · axial · 0.44mm/px · z∈[-66,+60]mm · 7 of 35 slices shown, 9 images]
[im 5/35  brain]
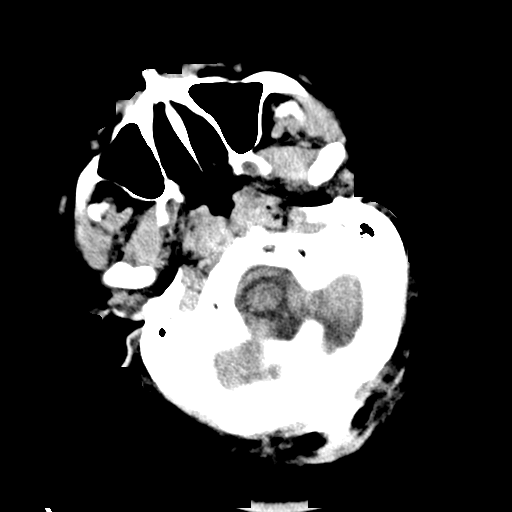
[im 5/35  bone]
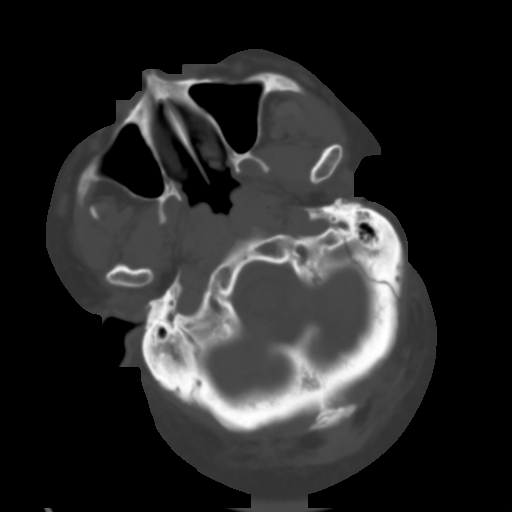
[im 9/35  brain]
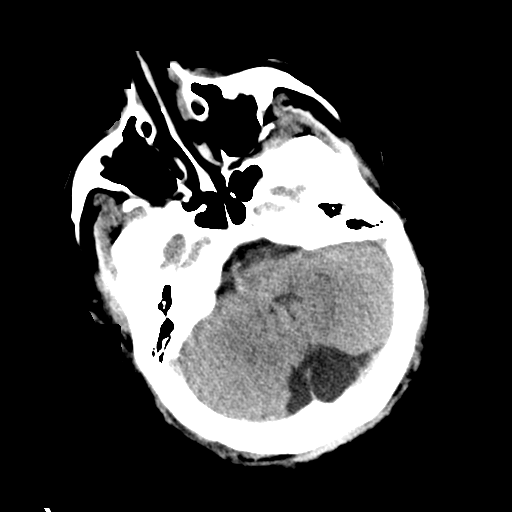
[im 13/35  brain]
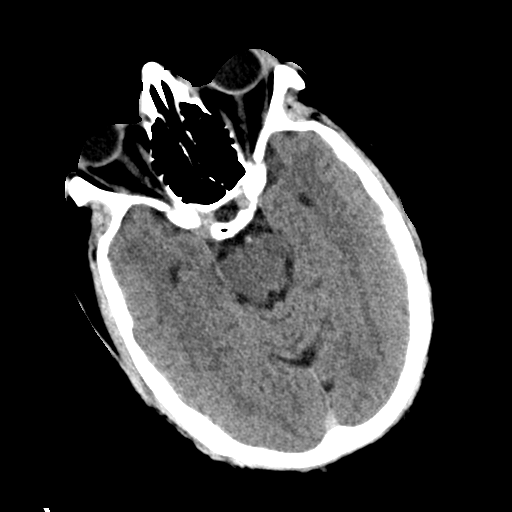
[im 18/35  brain]
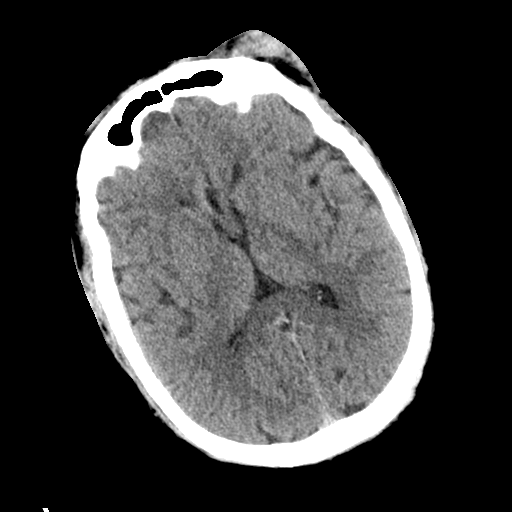
[im 22/35  brain]
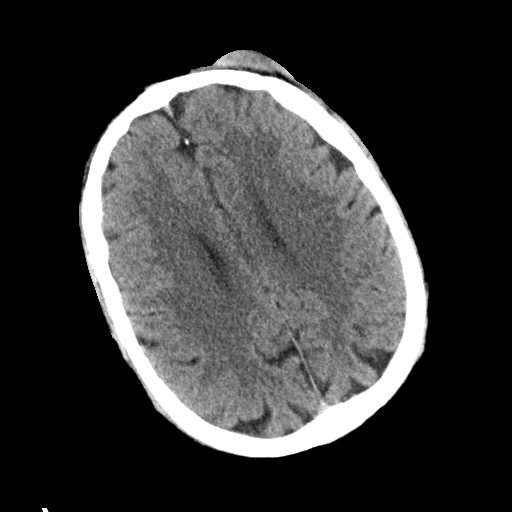
[im 22/35  bone]
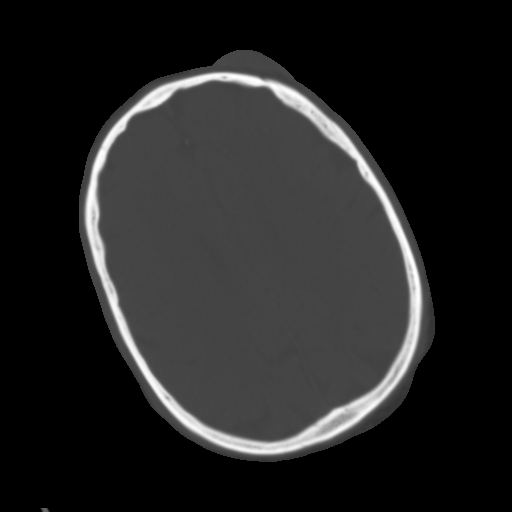
[im 26/35  brain]
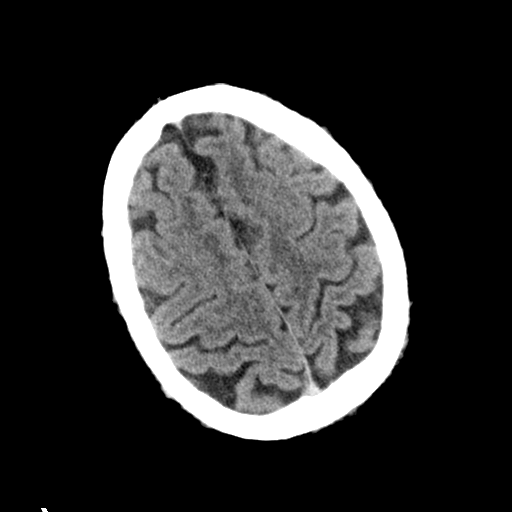
[im 30/35  brain]
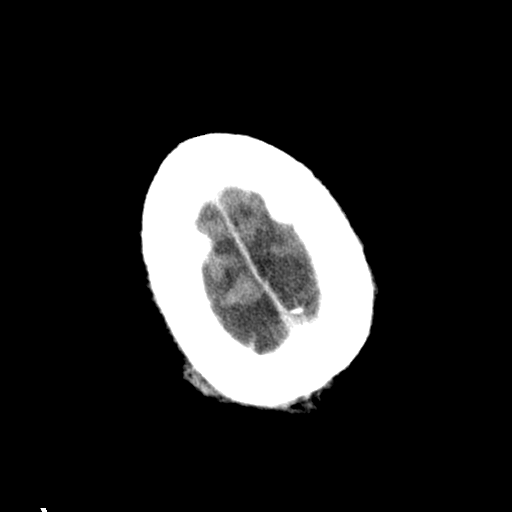

[Series 4: head 2.0 bone · axial · 0.44mm/px · z∈[-70,-36]mm · 3 of 87 slices shown]
[im 9/87  bone]
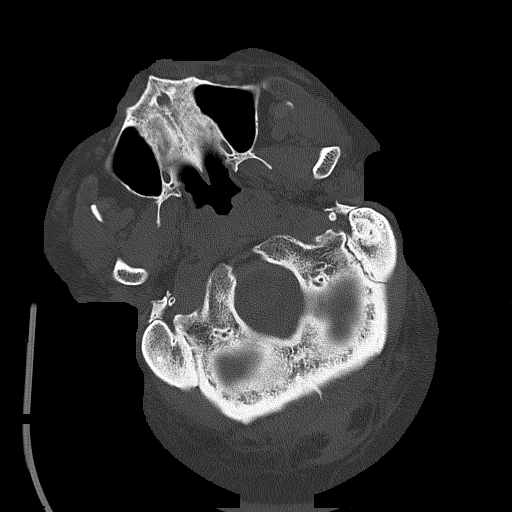
[im 18/87  bone]
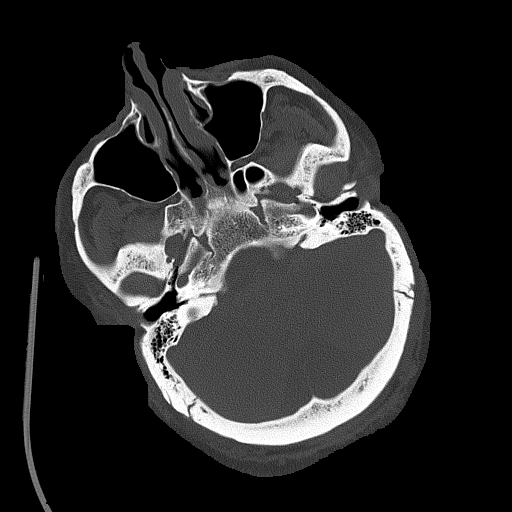
[im 26/87  bone]
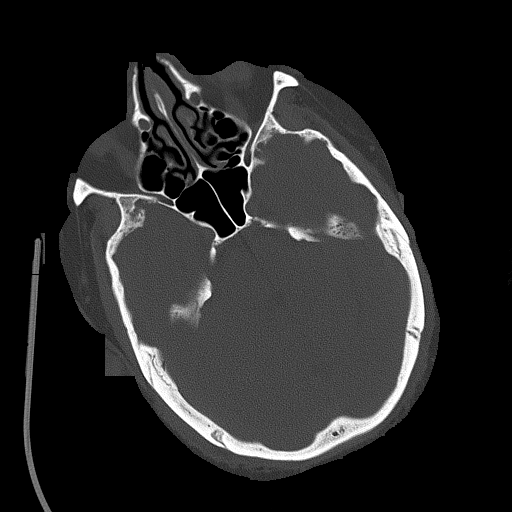

[Series 5: head 3.0 cor st · coronal · 0.34mm/px · 3 of 74 slices shown]
[im 25/74  brain]
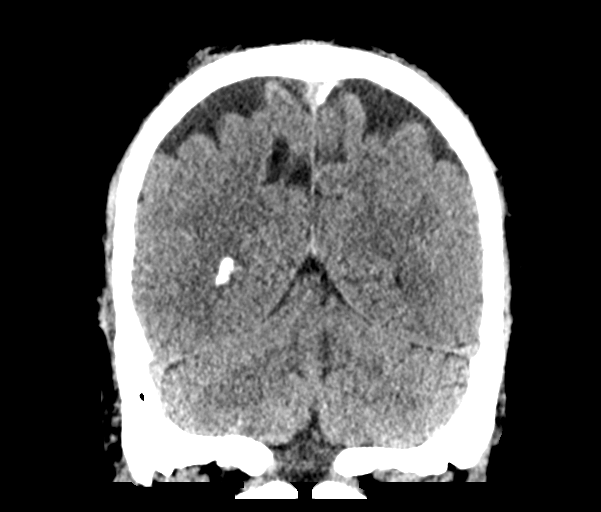
[im 33/74  brain]
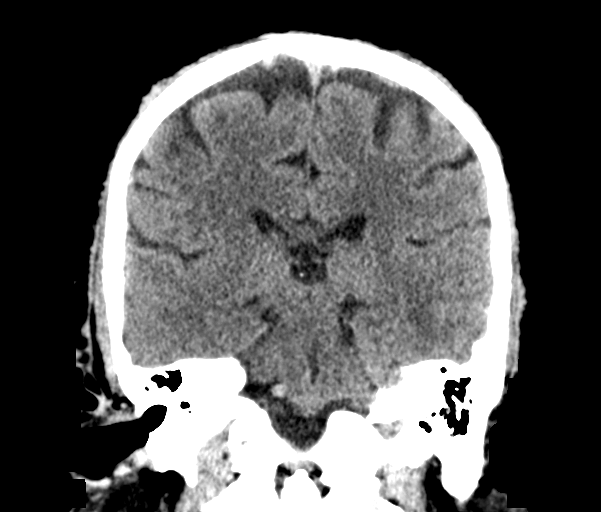
[im 41/74  brain]
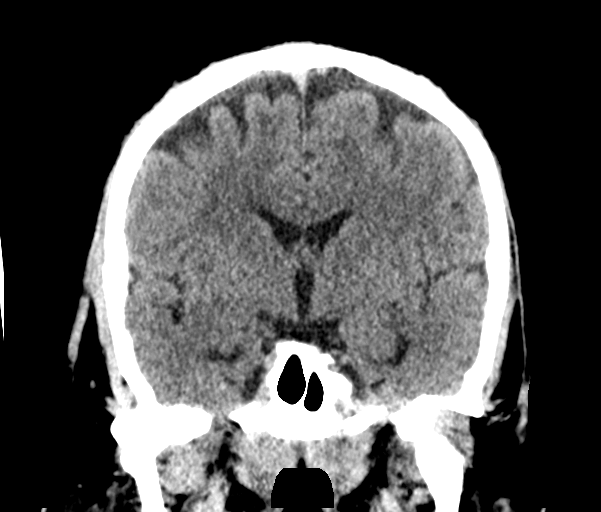

[Series 6: head 3.0 sag st · sagittal · 0.34mm/px · 3 of 67 slices shown]
[im 23/67  brain]
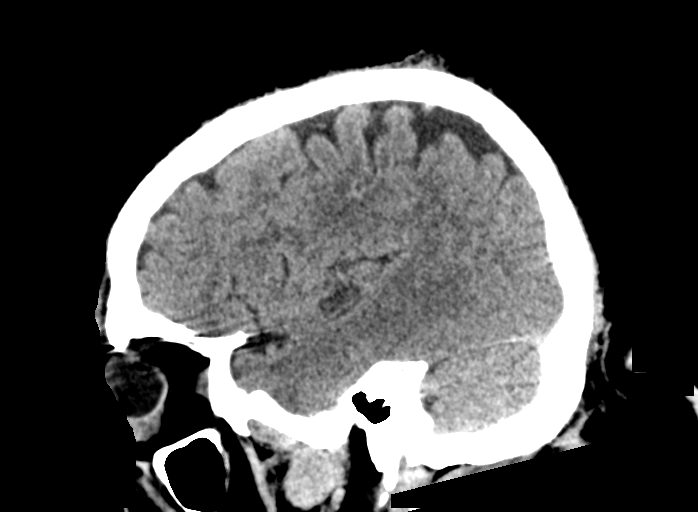
[im 34/67  brain]
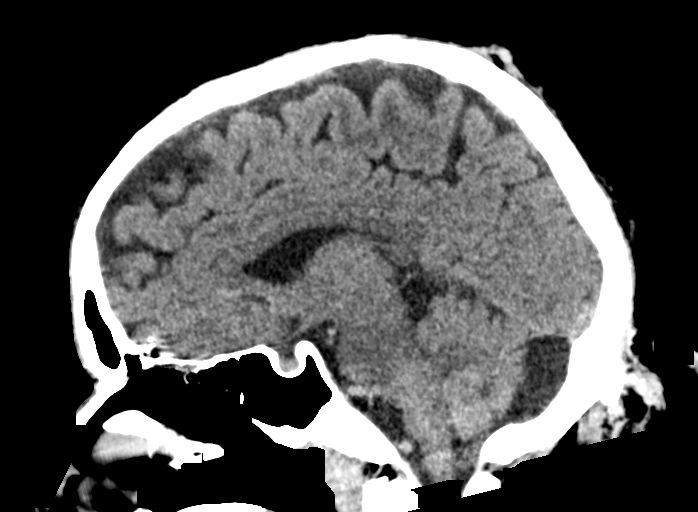
[im 45/67  brain]
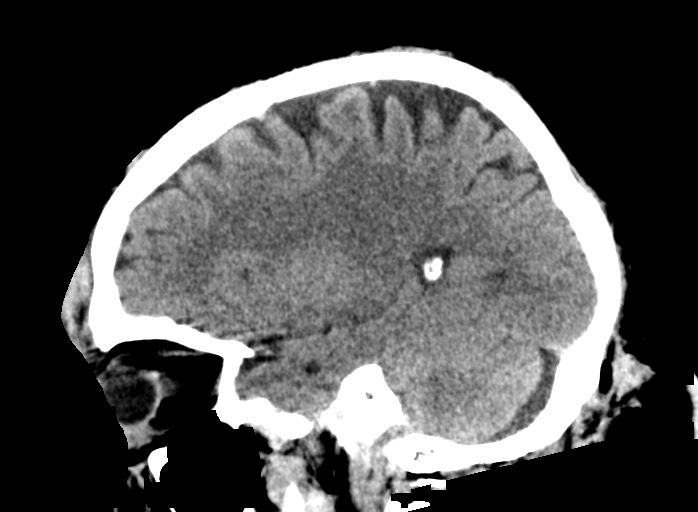

[16 of 47 positions shown; findings below may reference images not displayed]

FINDINGS: Brain: No evidence of acute large vascular territory infarction,
hemorrhage, hydrocephalus, extra-axial collection or mass
lesion/mass effect. Mildly prominent retro cerebellar CSF which
likely represents mega cisterna magna or chronic arachnoid cyst.
Mild patchy white matter hypoattenuation, most likely related to
chronic microvascular ischemic disease.

Vascular: No hyperdense vessel identified. Mild calcific
atherosclerosis.

Skull: Large left frontal/periorbital soft tissue contusion without
acute fracture.

Sinuses/Orbits: Clear sinuses. Partially imaged multiple maxillary
periapical lucencies. Left periorbital contusion. Otherwise,
unremarkable orbits.

Other: No mastoid effusions.

ASPECTS (Alberta Stroke Program Early CT Score) total score (0-10
with 10 being normal): 10.
IMPRESSION: 1. No evidence of acute intracranial abnormality.  ASPECTS is 10.
2. Large left frontal/periorbital soft tissue contusion without
acute fracture.
3. Mild suspected chronic microvascular ischemic disease.

Code stroke imaging results were communicated on 03/30/2021 at [DATE] to provider Dr. Quirijn via secure text paging.

## 2023-05-04 ENCOUNTER — Other Ambulatory Visit: Payer: Self-pay | Admitting: Cardiology

## 2023-05-22 DIAGNOSIS — M6281 Muscle weakness (generalized): Secondary | ICD-10-CM | POA: Insufficient documentation

## 2023-05-22 HISTORY — DX: Muscle weakness (generalized): M62.81

## 2023-06-03 ENCOUNTER — Other Ambulatory Visit: Payer: Self-pay | Admitting: Family Medicine

## 2023-06-03 DIAGNOSIS — I10 Essential (primary) hypertension: Secondary | ICD-10-CM

## 2023-06-03 DIAGNOSIS — I5043 Acute on chronic combined systolic (congestive) and diastolic (congestive) heart failure: Secondary | ICD-10-CM

## 2023-06-04 ENCOUNTER — Other Ambulatory Visit: Payer: Self-pay | Admitting: Family Medicine

## 2023-06-04 DIAGNOSIS — I428 Other cardiomyopathies: Secondary | ICD-10-CM

## 2023-06-04 NOTE — Telephone Encounter (Signed)
Called pt - pt states he has a new PCP. Pt states that some paperwork will be coming for Dr. Alvis Lemmings to fill out.

## 2023-06-04 NOTE — Telephone Encounter (Signed)
Requested medications are due for refill today.  yes  Requested medications are on the active medications list.  yes  Last refill. 09/15/2022 #30 6 rf  Future visit scheduled.   no  Notes to clinic.  Pt seeing a new PCP.     Requested Prescriptions  Pending Prescriptions Disp Refills   FARXIGA 5 MG TABS tablet [Pharmacy Med Name: FARXIGA 5MG  TABLETS] 30 tablet 6    Sig: TAKE 1 TABLET(5 MG) BY MOUTH DAILY BEFORE BREAKFAST     Endocrinology:  Diabetes - SGLT2 Inhibitors Failed - 06/04/2023  9:17 AM      Failed - HBA1C is between 0 and 7.9 and within 180 days    HbA1c, POC (controlled diabetic range)  Date Value Ref Range Status  09/15/2022 5.8 0.0 - 7.0 % Final         Failed - Valid encounter within last 6 months    Recent Outpatient Visits           8 months ago Type 2 diabetes mellitus with other specified complication, without long-term current use of insulin (HCC)   [place] [facility] [place], [place], [place]   1 year ago Type 2 diabetes mellitus with other specified complication, without long-term current use of insulin (HCC)   [place] [facility] [place], [place], MD   1 year ago Type 2 diabetes mellitus without complication, without long-term current use of insulin [facility])   [place] [facility] [place], [place], [place]   3 years ago Type 2 diabetes mellitus with other circulatory complication, without long-term current use of insulin (HCC)   [place] [facility] [place], Odette Horns, MD   4 years ago Screening for colon cancer   [place] [facility] [place], Odette Horns, MD              Passed - Cr in normal range and within 360 days    Creatinine, Ser  Date Value Ref Range Status  09/15/2022 1.14 0.76 - 1.27 mg/dL Final         Passed - eGFR in normal range and within 360 days    GFR calc Af Amer  Date Value Ref Range Status  06/15/2019 74 >59  mL/min/1.73 Final   GFR, Estimated  Date Value Ref Range Status  03/30/2021 >60 >60 mL/min Final    Comment:    (NOTE) Calculated using the CKD-EPI Creatinine Equation (2021)    eGFR  Date Value Ref Range Status  09/15/2022 76 >59 mL/min/1.73 Final

## 2023-07-03 ENCOUNTER — Other Ambulatory Visit: Payer: Self-pay | Admitting: Family Medicine

## 2023-07-03 ENCOUNTER — Other Ambulatory Visit: Payer: Self-pay | Admitting: Cardiology

## 2023-07-03 DIAGNOSIS — I10 Essential (primary) hypertension: Secondary | ICD-10-CM

## 2023-07-03 NOTE — Telephone Encounter (Signed)
Rx refill sent to pharmacy. 

## 2023-08-02 ENCOUNTER — Other Ambulatory Visit: Payer: Self-pay | Admitting: Family Medicine

## 2023-08-02 DIAGNOSIS — I428 Other cardiomyopathies: Secondary | ICD-10-CM

## 2023-08-03 NOTE — Telephone Encounter (Signed)
Requested medication (s) are due for refill today: yes  Requested medication (s) are on the active medication list: yes  Last refill:  06/04/23 #30  Future visit scheduled: yes  Notes to clinic:  message sent to pt via MyChart to call for appointment   Requested Prescriptions  Pending Prescriptions Disp Refills   FARXIGA 5 MG TABS tablet [Pharmacy Med Name: FARXIGA 5MG  TABLETS] 30 tablet 0    Sig: TAKE 1 TABLET BY MOUTH EVERY DAY BEFORE BREAKFAST     Endocrinology:  Diabetes - SGLT2 Inhibitors Failed - 08/02/2023  7:31 AM      Failed - HBA1C is between 0 and 7.9 and within 180 days    HbA1c, POC (controlled diabetic range)  Date Value Ref Range Status  09/15/2022 5.8 0.0 - 7.0 % Final         Failed - Valid encounter within last 6 months    Recent Outpatient Visits           10 months ago Type 2 diabetes mellitus with other specified complication, without long-term current use of insulin (HCC)   [place] [facility] [place], [place], [place]   1 year ago Type 2 diabetes mellitus with other specified complication, without long-term current use of insulin (HCC)   [place] [facility] [place], [place], MD   1 year ago Type 2 diabetes mellitus without complication, without long-term current use of insulin [facility])   [place] [facility] [place], [place], [place]   3 years ago Type 2 diabetes mellitus with other circulatory complication, without long-term current use of insulin (HCC)   [place] [facility] [place], Odette Horns, MD   4 years ago Screening for colon cancer   [facility] [place], Odette Horns, MD              Passed - Cr in normal range and within 360 days    Creatinine, Ser  Date Value Ref Range Status  09/15/2022 1.14 0.76 - 1.27 mg/dL Final         Passed - eGFR in normal range and within 360 days    GFR calc Af Amer  Date Value Ref Range  Status  06/15/2019 74 >59 mL/min/1.73 Final   GFR, Estimated  Date Value Ref Range Status  03/30/2021 >60 >60 mL/min Final    Comment:    (NOTE) Calculated using the CKD-EPI Creatinine Equation (2021)    eGFR  Date Value Ref Range Status  09/15/2022 76 >59 mL/min/1.73 Final

## 2023-09-01 ENCOUNTER — Other Ambulatory Visit: Payer: Self-pay | Admitting: Family Medicine

## 2023-09-01 DIAGNOSIS — I5043 Acute on chronic combined systolic (congestive) and diastolic (congestive) heart failure: Secondary | ICD-10-CM

## 2023-09-01 DIAGNOSIS — I10 Essential (primary) hypertension: Secondary | ICD-10-CM

## 2023-09-01 NOTE — Telephone Encounter (Signed)
Requested medications are due for refill today.  yes  Requested medications are on the active medications list.  yes  Last refill. 09/15/2022 #90 1 rf  Future visit scheduled.   no  Notes to clinic.  Pt is more than 3 months over due for an OV.     Requested Prescriptions  Pending Prescriptions Disp Refills   metoprolol tartrate (LOPRESSOR) 25 MG tablet [Pharmacy Med Name: METOPROLOL TARTRATE 25MG  TABLETS] 90 tablet 1    Sig: TAKE 1/2 TABLET(12.5 MG) BY MOUTH TWICE DAILY     Cardiovascular:  Beta Blockers Failed - 09/01/2023  7:12 AM      Failed - Valid encounter within last 6 months    Recent Outpatient Visits           11 months ago Type 2 diabetes mellitus with other specified complication, without long-term current use of insulin (HCC)   [place] [facility] [place], [place], MD   1 year ago Type 2 diabetes mellitus with other specified complication, without long-term current use of insulin (HCC)   [place] [facility] [place], [place], MD   1 year ago Type 2 diabetes mellitus without complication, without long-term current use of insulin [facility])   [place] [facility] [place], [place], [place]   4 years ago Type 2 diabetes mellitus with other circulatory complication, without long-term current use of insulin [facility])   [place] [facility] Hoy Register, MD   4 years ago Screening for colon cancer   [place] [facility] [place], Odette Horns, MD              Passed - Last BP in normal range    BP Readings from Last 1 Encounters:  09/15/22 112/64         Passed - Last Heart Rate in normal range    Pulse Readings from Last 1 Encounters:  09/15/22 68

## 2023-09-08 ENCOUNTER — Other Ambulatory Visit: Payer: Self-pay | Admitting: Family Medicine

## 2023-09-08 DIAGNOSIS — I428 Other cardiomyopathies: Secondary | ICD-10-CM

## 2023-10-03 ENCOUNTER — Other Ambulatory Visit: Payer: Self-pay | Admitting: Cardiology

## 2023-10-03 ENCOUNTER — Other Ambulatory Visit: Payer: Self-pay | Admitting: Family Medicine

## 2023-10-03 DIAGNOSIS — I5043 Acute on chronic combined systolic (congestive) and diastolic (congestive) heart failure: Secondary | ICD-10-CM

## 2023-10-15 ENCOUNTER — Other Ambulatory Visit: Payer: Self-pay | Admitting: Family Medicine

## 2023-10-15 DIAGNOSIS — I428 Other cardiomyopathies: Secondary | ICD-10-CM

## 2023-10-15 DIAGNOSIS — I5043 Acute on chronic combined systolic (congestive) and diastolic (congestive) heart failure: Secondary | ICD-10-CM

## 2023-10-21 ENCOUNTER — Ambulatory Visit: Payer: BLUE CROSS/BLUE SHIELD | Admitting: Physician Assistant

## 2023-10-26 ENCOUNTER — Other Ambulatory Visit: Payer: Self-pay

## 2023-10-26 MED ORDER — POTASSIUM CHLORIDE CRYS ER 20 MEQ PO TBCR: 20.0000 meq | EXTENDED_RELEASE_TABLET | Freq: Every day | ORAL | 0 refills | Status: DC

## 2023-10-29 DIAGNOSIS — G8929 Other chronic pain: Secondary | ICD-10-CM | POA: Insufficient documentation

## 2023-10-29 DIAGNOSIS — M25551 Pain in right hip: Secondary | ICD-10-CM

## 2023-10-29 DIAGNOSIS — M545 Low back pain, unspecified: Secondary | ICD-10-CM

## 2023-10-29 HISTORY — DX: Pain in right hip: M25.551

## 2023-10-29 HISTORY — DX: Low back pain, unspecified: M54.50

## 2023-11-12 ENCOUNTER — Ambulatory Visit: Payer: BLUE CROSS/BLUE SHIELD | Admitting: Physician Assistant

## 2023-11-12 ENCOUNTER — Other Ambulatory Visit: Payer: Self-pay | Admitting: Cardiology

## 2023-11-12 ENCOUNTER — Other Ambulatory Visit: Payer: Self-pay | Admitting: Family Medicine

## 2023-11-12 DIAGNOSIS — I5043 Acute on chronic combined systolic (congestive) and diastolic (congestive) heart failure: Secondary | ICD-10-CM

## 2023-11-12 DIAGNOSIS — I10 Essential (primary) hypertension: Secondary | ICD-10-CM

## 2023-11-25 ENCOUNTER — Other Ambulatory Visit: Payer: Self-pay | Admitting: Family Medicine

## 2023-11-25 DIAGNOSIS — I428 Other cardiomyopathies: Secondary | ICD-10-CM

## 2023-11-25 NOTE — Telephone Encounter (Signed)
Requested medication (s) are due for refill today: Yes  Requested medication (s) are on the active medication list: Yes  Last refill:  10/15/23 #30, 0 refills  Future visit scheduled: Yes, 12/03/23  Notes to clinic:  Unable to refill per protocol, courtesy refill already given, routing for provider approval.      Requested Prescriptions  Pending Prescriptions Disp Refills   FARXIGA 5 MG TABS tablet [Pharmacy Med Name: FARXIGA 5MG  TABLETS] 30 tablet 0    Sig: TAKE 1 TABLET BY MOUTH EVERY DAY BEFORE BREAKFAST     Endocrinology:  Diabetes - SGLT2 Inhibitors Failed - 11/25/2023  2:52 PM      Failed - Cr in normal range and within 360 days    Creatinine, Ser  Date Value Ref Range Status  09/15/2022 1.14 0.76 - 1.27 mg/dL Final         Failed - HBA1C is between 0 and 7.9 and within 180 days    HbA1c, POC (controlled diabetic range)  Date Value Ref Range Status  09/15/2022 5.8 0.0 - 7.0 % Final         Failed - eGFR in normal range and within 360 days    GFR calc Af Amer  Date Value Ref Range Status  06/15/2019 74 >59 mL/min/1.73 Final   GFR, Estimated  Date Value Ref Range Status  03/30/2021 >60 >60 mL/min Final    Comment:    (NOTE) Calculated using the CKD-EPI Creatinine Equation (2021)    eGFR  Date Value Ref Range Status  09/15/2022 76 >59 mL/min/1.73 Final         Failed - Valid encounter within last 6 months    Recent Outpatient Visits           1 year ago Type 2 diabetes mellitus with other specified complication, without long-term current use of insulin (HCC)   [place] [facility] [place] Hoy Register, MD   1 year ago Type 2 diabetes mellitus with other specified complication, without long-term current use of insulin (HCC)   [place] [facility] [place] Hoy Register, MD   1 year ago Type 2 diabetes mellitus without complication, without long-term current use of  insulin (HCC)   [place] [facility] [place]. [facility], [place], [place]   4 years ago Type 2 diabetes mellitus with other circulatory complication, without long-term current use of insulin (HCC)   [place] [facility] [place]. [facility] Hoy Register, MD   4 years ago Screening for colon cancer   [place] [facility] . [facility] Hoy Register, MD       Future Appointments             In 1 week [place] [facility] [place]a Bridegroom. [facility]

## 2023-11-27 ENCOUNTER — Other Ambulatory Visit: Payer: Self-pay | Admitting: Cardiology

## 2023-11-27 ENCOUNTER — Other Ambulatory Visit: Payer: Self-pay | Admitting: Family Medicine

## 2023-11-27 DIAGNOSIS — I428 Other cardiomyopathies: Secondary | ICD-10-CM

## 2023-11-27 MED ORDER — DAPAGLIFLOZIN PROPANEDIOL 5 MG PO TABS: 5.0000 mg | ORAL_TABLET | Freq: Every day | ORAL | 0 refills | Status: DC

## 2023-12-03 ENCOUNTER — Ambulatory Visit: Payer: No Typology Code available for payment source | Attending: Physician Assistant | Admitting: Physician Assistant

## 2023-12-03 ENCOUNTER — Encounter: Payer: Self-pay | Admitting: Physician Assistant

## 2023-12-03 VITALS — BP 143/76 | HR 67 | Wt 225.4 lb

## 2023-12-03 DIAGNOSIS — Z139 Encounter for screening, unspecified: Secondary | ICD-10-CM

## 2023-12-03 DIAGNOSIS — E876 Hypokalemia: Secondary | ICD-10-CM | POA: Diagnosis not present

## 2023-12-03 DIAGNOSIS — I5043 Acute on chronic combined systolic (congestive) and diastolic (congestive) heart failure: Secondary | ICD-10-CM | POA: Diagnosis not present

## 2023-12-03 DIAGNOSIS — I428 Other cardiomyopathies: Secondary | ICD-10-CM

## 2023-12-03 DIAGNOSIS — R739 Hyperglycemia, unspecified: Secondary | ICD-10-CM

## 2023-12-03 DIAGNOSIS — I10 Essential (primary) hypertension: Secondary | ICD-10-CM | POA: Diagnosis not present

## 2023-12-03 DIAGNOSIS — E782 Mixed hyperlipidemia: Secondary | ICD-10-CM | POA: Diagnosis not present

## 2023-12-03 MED ORDER — METOPROLOL TARTRATE 25 MG PO TABS: ORAL_TABLET | ORAL | 1 refills | Status: DC

## 2023-12-03 MED ORDER — DAPAGLIFLOZIN PROPANEDIOL 5 MG PO TABS: 5.0000 mg | ORAL_TABLET | Freq: Every day | ORAL | 4 refills | Status: DC

## 2023-12-03 MED ORDER — ISOSORBIDE MONONITRATE ER 30 MG PO TB24: 60.0000 mg | ORAL_TABLET | Freq: Every day | ORAL | 2 refills | Status: DC

## 2023-12-03 MED ORDER — FUROSEMIDE 40 MG PO TABS: 40.0000 mg | ORAL_TABLET | Freq: Every day | ORAL | 1 refills | Status: DC

## 2023-12-03 MED ORDER — ATORVASTATIN CALCIUM 20 MG PO TABS: ORAL_TABLET | ORAL | 0 refills | Status: DC

## 2023-12-03 MED ORDER — SPIRONOLACTONE 25 MG PO TABS: 25.0000 mg | ORAL_TABLET | Freq: Every day | ORAL | 3 refills | Status: DC

## 2023-12-03 NOTE — Patient Instructions (Signed)
Check blood pressures outside of office and record and take these numbers with you to upcoming doctor's appts.

## 2023-12-03 NOTE — Progress Notes (Signed)
Patient ID: Jim Garcia, male   DOB: 01/18/1966, 58 y.o.   MRN: [id number]   Jim Garcia, is a 58 y.o. male  [id number]  [id number]  DOB - 07/20/1966  Chief Complaint  Patient presents with   Medical Management of Chronic Issues    Patient stated he has been having seizure       Subjective:   Jim Garcia is a 58 y.o. male here today for med RF.  He is being followed closely by neurology bc he had been having more seizures.  He was last seen by cardiology in about 2023.  No CP.  He has missed follow-up appts here and is overdue for blood work and seeing Dr Alvis Lemmings.  No SOB.  No edema.  Denies CP/HA/changes in vision or dizziness.  He says neurology decreased his lasix to once daily from twice daily.    No problems updated.  ALLERGIES: No Known Allergies  PAST MEDICAL HISTORY: Past Medical History:  Diagnosis Date   Acute on chronic combined systolic and diastolic CHF, NYHA class 3 (HCC) 04/14/2018   Acute on chronic systolic heart failure (HCC) 01/26/2022   CHF (congestive heart failure) (HCC)    Edema 06/12/2015   Essential hypertension 04/15/2018   Hyperlipidemia 06/12/2015   Hypokalemia    Morbid obesity due to excess calories (HCC) 06/13/2015   Non-ischemic cardiomyopathy (HCC) 04/15/2018   Obesity (BMI 30.0-34.9) 11/24/2018   Sleep apnea, obstructive 04/15/2018   Stage 2 hypertension 01/26/2022   Syncopal episodes 01/26/2022   Type 2 diabetes mellitus without complication (HCC) 07/24/2015    MEDICATIONS AT HOME: Prior to Admission medications   Medication Sig Start Date End Date Taking? Authorizing Provider  divalproex (DEPAKOTE) 250 MG DR tablet 750mg  am and 1000mg  pm 11/29/23  Yes [provider]  atorvastatin (LIPITOR) 20 MG tablet TAKE 1 TABLET(20 MG) BY MOUTH DAILY 12/03/23   Georgian Co M, PA-C  dapagliflozin propanediol (FARXIGA) 5 MG TABS tablet Take 1 tablet (5 mg total) by mouth daily. 12/03/23   Anders Simmonds, PA-C  furosemide (LASIX) 40 MG  tablet Take 1 tablet (40 mg total) by mouth daily. 12/03/23   Anders Simmonds, PA-C  hydrALAZINE (APRESOLINE) 50 MG tablet TAKE 1 TABLET(50 MG) BY MOUTH THREE TIMES DAILY 03/12/23   Hoy Register, MD  isosorbide mononitrate (IMDUR) 30 MG 24 hr tablet Take 2 tablets (60 mg total) by mouth daily. 12/03/23   Anders Simmonds, PA-C  metoprolol tartrate (LOPRESSOR) 25 MG tablet TAKE 1/2 TABLET(12.5 MG) BY MOUTH TWICE DAILY 12/03/23   Georgian Co M, PA-C  potassium chloride SA (KLOR-CON M) 20 MEQ tablet Take 1 tablet (20 mEq total) by mouth daily. Final attempt, patient needs and appt for additional refills 11/12/23   Baldo Daub, MD  predniSONE (DELTASONE) 20 MG tablet Take 1 tablet (20 mg total) by mouth daily with breakfast. 06/04/22   Hoy Register, MD  sacubitril-valsartan (ENTRESTO) 97-103 MG Take 1 tablet by mouth 2 (two) times daily. 1rst attempt, patient needs and appt for additional refills 10/05/23   Baldo Daub, MD  spironolactone (ALDACTONE) 25 MG tablet Take 1 tablet (25 mg total) by mouth daily. 12/03/23   Anders Simmonds, PA-C  TRUEPLUS SAFETY LANCETS 28G MISC 1 Stick by Does not apply route 3 (three) times daily. Patient not taking: Reported on 07/15/2022 05/18/18   Hoy Register, MD    ROS: Neg HEENT Neg resp Neg cardiac Neg GI Neg GU Neg MS Neg psych Neg neuro  Objective:   Vitals:   12/03/23 0954  BP: (!) 160/93  Pulse: 67  SpO2: 98%  Weight: 225 lb 6.4 oz (102.2 kg)   Exam General appearance : Awake, alert, not in any distress. Speech Clear. Not toxic looking HEENT: Atraumatic and Normocephalic Neck: Supple, no JVD. No cervical lymphadenopathy.  Chest: Good air entry bilaterally, CTAB.  No rales/rhonchi/wheezing CVS: S1 S2 regular, no murmurs.  Extremities: B/L Lower Ext shows no edema, both legs are warm to touch Neurology: Awake alert, and oriented X 3, CN II-XII intact, Non focal Skin: No Rash  Data Review Lab Results  Component Value Date    HGBA1C 5.8 09/15/2022   HGBA1C 6.0 06/04/2022   HGBA1C 6.1 02/26/2022    Assessment & Plan   1. Acute on chronic combined systolic and diastolic CHF, NYHA class 3 (HCC) Referral to see Dr Dulce Sellar placed.  No signs of failure today - spironolactone (ALDACTONE) 25 MG tablet; Take 1 tablet (25 mg total) by mouth daily.  Dispense: 30 tablet; Refill: 3 - metoprolol tartrate (LOPRESSOR) 25 MG tablet; TAKE 1/2 TABLET(12.5 MG) BY MOUTH TWICE DAILY  Dispense: 90 tablet; Refill: 1 - Ambulatory referral to Cardiology - furosemide (LASIX) 40 MG tablet; Take 1 tablet (40 mg total) by mouth daily.  Dispense: 90 tablet; Refill: 1  2. Essential hypertension 2nd reading was ok-he has not been taking metoprolol bc out - metoprolol tartrate (LOPRESSOR) 25 MG tablet; TAKE 1/2 TABLET(12.5 MG) BY MOUTH TWICE DAILY  Dispense: 90 tablet; Refill: 1 - isosorbide mononitrate (IMDUR) 30 MG 24 hr tablet; Take 2 tablets (60 mg total) by mouth daily.  Dispense: 60 tablet; Refill: 2  3. Mixed hyperlipidemia - atorvastatin (LIPITOR) 20 MG tablet; TAKE 1 TABLET(20 MG) BY MOUTH DAILY  Dispense: 90 tablet; Refill: 0 - Lipid Panel  4. Non-ischemic cardiomyopathy (HCC) - atorvastatin (LIPITOR) 20 MG tablet; TAKE 1 TABLET(20 MG) BY MOUTH DAILY  Dispense: 90 tablet; Refill: 0 - dapagliflozin propanediol (FARXIGA) 5 MG TABS tablet; Take 1 tablet (5 mg total) by mouth daily.  Dispense: 30 tablet; Refill: 4  5. Hypokalemia (Primary) Will assess need for replacement - spironolactone (ALDACTONE) 25 MG tablet; Take 1 tablet (25 mg total) by mouth daily.  Dispense: 30 tablet; Refill: 3  6. Hyperglycemia Work at a goal of eliminating sugary drinks, candy, desserts, sweets, refined sugars, processed foods, and white carbohydrates.  - Hemoglobin A1c    Return in about 3 months (around 03/02/2024) for PCP for chronic conditions-Newlin.  The patient was given clear instructions to go to ER or return to medical center if symptoms  don't improve, worsen or new problems develop. The patient verbalized understanding. The patient was told to call to get lab results if they haven't heard anything in the next week.      Georgian Co, PA-C [facility] [place], [place] [phone]   12/03/2023, 10:38 AM

## 2023-12-04 ENCOUNTER — Encounter: Payer: Self-pay | Admitting: Physician Assistant

## 2023-12-04 ENCOUNTER — Telehealth: Payer: Self-pay

## 2023-12-04 LAB — LIPID PANEL
Chol/HDL Ratio: 3.3 {ratio} (ref 0.0–5.0)
Cholesterol, Total: 145 mg/dL (ref 100–199)
HDL: 44 mg/dL (ref >39)
LDL Chol Calc (NIH): 87 mg/dL (ref 0–99)
Triglycerides: 73 mg/dL (ref 0–149)
VLDL Cholesterol Cal: 14 mg/dL (ref 5–40)

## 2023-12-04 LAB — HEMOGLOBIN A1C
Est. average glucose Bld gHb Est-mCnc: 111 mg/dL
Hgb A1c MFr Bld: 5.5 % (ref 4.8–5.6)

## 2023-12-04 NOTE — Telephone Encounter (Signed)
-----   Message from Georgian Co sent at 12/04/2023  9:58 AM EST ----- Cholesterol is good and no diabetes(A1C) is normal.  Thanks, Georgian Co, PA-C

## 2023-12-04 NOTE — Telephone Encounter (Signed)
 Patient viewed results and Doctor comment through [facility]

## 2023-12-06 ENCOUNTER — Other Ambulatory Visit: Payer: Self-pay | Admitting: Family Medicine

## 2023-12-06 DIAGNOSIS — I428 Other cardiomyopathies: Secondary | ICD-10-CM

## 2023-12-07 NOTE — Addendum Note (Signed)
Addended by: Dalene Carrow I on: 12/07/2023 03:52 PM   Modules accepted: Orders

## 2023-12-08 ENCOUNTER — Telehealth: Payer: Self-pay

## 2023-12-08 NOTE — Progress Notes (Signed)
   Telephone encounter was:  Successful.  Complex Care Management Note Care Guide Note  12/08/2023 Name: Jim Garcia MRN: [id number] DOB: [birth year]  Jim Garcia is a 58 y.o. year old male who is a primary care patient of Newlin, [place]ong, MD . The community resource team was consulted for assistance with Transportation Needs , Food Insecurity, and Financial Difficulties related to financial strain  SDOH screenings and interventions completed:  Yes  Social Drivers of Health From This Encounter   Food Insecurity: Food Insecurity Present (12/08/2023)   Hunger Vital Sign    Worried About Running Out of Food in the Last Year: Often true    Ran Out of Food in the Last Year: Often true  Financial Resource Strain: High Risk (12/08/2023)   Overall Financial Resource Strain (CARDIA)    Difficulty of Paying Living Expenses: Very hard  Utilities: At Risk (12/08/2023)   Utilities    Threatened with loss of utilities: Yes    SDOH Interventions Today    Flowsheet Row Most Recent Value  SDOH Interventions   Food Insecurity Interventions Community Resources Provided  Utilities Interventions Community Resources Provided  Financial Strain Interventions Community Resources Provided        Care guide performed the following interventions: Patient provided with information about care guide support team and interviewed to confirm resource needs. Pt is having seizures and cant drive. Patient has financial strain due to not being able to work. Patient wants to reapply for food stamps and transportation, As requested I will be mailing and emailing resources for food and financial assistance. I will be following up on 2/5 with resources   Follow Up Plan:  Care guide will follow up with patient by phone over the next day  Encounter Outcome:  Patient Visit Completed    [facility]  [facility] Guide, Phone: [phone] Fax: [phone] Website:  [email or web address]

## 2023-12-11 ENCOUNTER — Telehealth: Payer: Self-pay

## 2023-12-11 NOTE — Progress Notes (Signed)
   Telephone encounter was:  Unsuccessful.  12/11/2023 Name: Reeves Musick MRN: [id number] DOB: [birth year]  Unsuccessful outbound call made today to assist with:  Transportation Needs , Food Insecurity, and Financial Difficulties related to Financial strain  [employer] Attempt:  2nd Attempt  A HIPAA compliant voice message was left requesting a return call.  Instructed patient to call back. I have mailed and emailed resources as well as added a referral to [employer] for Mobile food resources     [facility]  [facility] Guide, Phone: [phone] Fax: [phone] Website: [email or web address]

## 2023-12-14 ENCOUNTER — Ambulatory Visit: Payer: No Typology Code available for payment source

## 2023-12-14 VITALS — BP 140/88 | HR 57 | Ht 75.0 in | Wt 230.8 lb

## 2023-12-14 DIAGNOSIS — I10 Essential (primary) hypertension: Secondary | ICD-10-CM | POA: Diagnosis not present

## 2023-12-14 DIAGNOSIS — E782 Mixed hyperlipidemia: Secondary | ICD-10-CM

## 2023-12-14 DIAGNOSIS — I428 Other cardiomyopathies: Secondary | ICD-10-CM

## 2023-12-14 DIAGNOSIS — I5022 Chronic systolic (congestive) heart failure: Secondary | ICD-10-CM

## 2023-12-14 MED ORDER — SPIRONOLACTONE 25 MG PO TABS: 25.0000 mg | ORAL_TABLET | Freq: Every day | ORAL | 3 refills | Status: AC

## 2023-12-14 MED ORDER — ASPIRIN 81 MG PO TBEC: 81.0000 mg | DELAYED_RELEASE_TABLET | Freq: Every day | ORAL | 3 refills | Status: AC

## 2023-12-14 MED ORDER — FARXIGA 10 MG PO TABS
10.0000 mg | ORAL_TABLET | Freq: Every day | ORAL | 3 refills | Status: DC
Start: 2023-12-14 — End: 2024-08-23

## 2023-12-14 MED ORDER — SACUBITRIL-VALSARTAN 97-103 MG PO TABS: 1.0000 | ORAL_TABLET | Freq: Two times a day (BID) | ORAL | 3 refills | Status: AC

## 2023-12-14 MED ORDER — METOPROLOL SUCCINATE ER 25 MG PO TB24: 25.0000 mg | ORAL_TABLET | Freq: Every day | ORAL | 3 refills | Status: DC

## 2023-12-14 NOTE — Assessment & Plan Note (Signed)
 Nonischemic cardiomyopathy diagnosed in September 2016 at [facility]. LVEF 30% at the time, most recent echocardiogram from July 2024 at [facility] LVEF 40 to 45%, global hypokinesis.  On guideline directed medical therapy as reviewed under CHF below with metoprolol  XL, Farxiga , Entresto , spironolactone .  Advised to avoid alcohol.  He is rarely drinks on social occasions.

## 2023-12-14 NOTE — Assessment & Plan Note (Signed)
 Uncontrolled today. Target below 130 over 80 mmHg. He has been off metoprolol  for about a month and and Entresto  and spironolactone  for couple days. Refills being sent advised him to resume and monitor blood pressures.

## 2023-12-14 NOTE — Assessment & Plan Note (Signed)
 Continue atorvastatin  20 mg once daily. Lipid panel from 12-03-2023 LDL 87, HDL 44, total cholesterol 409, triglycerides 73.

## 2023-12-14 NOTE — Assessment & Plan Note (Signed)
 Euvolemic and compensated.  NYHA I, functional status limited due to right hip pain which seems to be well-controlled using Celebrex at this time. Advised about low-salt diet, below 2 g/day and keep fluid intake below 2 L/day. Advised him to monitor his weights consistently for any weight gain more than 2 to 3 pounds within a day or 4 pounds within a week should notify us .  Continue with furosemide  40 mg once daily.  Continue guideline directed medical therapy. Will transition from metoprolol  to tartrate to metoprolol  succinate 25 mg once daily, new prescription being sent. Will send refill prescription for Entresto  97-103 mg twice daily. Will send out a prescription refill for spironolactone  25 mg once daily Titrate up dapagliflozin  [Farxiga ] dose to 10 mg once daily.  Will recheck blood work with basic metabolic panel in couple weeks to check his elect lites and kidney function.

## 2023-12-14 NOTE — Patient Instructions (Signed)
 Medication Instructions:  Your physician has recommended you make the following change in your medication:   Stop Metoprolol  tartrate (Lopressor ) and start Metoprolol  succinate (Toprol  XL) 25 mg once daily.  Increase your Farxiga  to 10 mg daily  *If you need a refill on your cardiac medications before your next appointment, please call your pharmacy*   Lab Work: Your physician recommends that you return for lab work in: 4 weeks for BMP. [facility] lab is located on the 3rd floor, [address]. Hours are Monday - Friday 8 am to 4 pm, closed 11:30 am to 1:00 pm. You do NOT need an appointment.   If you have labs (blood work) drawn today and your tests are completely normal, you will receive your results only by: MyChart Message (if you have MyChart) OR A paper copy in the mail If you have any lab test that is abnormal or we need to change your treatment, we will call you to review the results.   Testing/Procedures: None ordered   Follow-Up: At [facility], you and your health needs are our priority.  As part of our continuing mission to provide you with exceptional heart care, we have created designated Provider Care Teams.  These Care Teams include your primary Cardiologist (physician) and Advanced Practice Providers (APPs -  Physician Assistants and Nurse Practitioners) who all work together to provide you with the care you need, when you need it.  We recommend signing up for the patient portal called "MyChart".  Sign up information is provided on this After Visit Summary.  MyChart is used to connect with patients for Virtual Visits (Telemedicine).  Patients are able to view lab/test results, encounter notes, upcoming appointments, etc.  Non-urgent messages can be sent to your provider as well.   To learn more about what you can do with MyChart, go to [email or web address].    Your next appointment:   12 month(s)  The format for your next appointment:   In  Person  Provider:   Bertha Broad, MD    Other Instructions none  Important Information About Sugar

## 2023-12-14 NOTE — Progress Notes (Signed)
 Cardiology Consultation:    Date:  12/14/2023   ID:  Jim Garcia, DOB [birth year], MRN [id number]  PCP:  Joaquin Mulberry, MD  Cardiologist:  Angelena Kells, MD   Referring MD: Hassie Lint, PA-C   No chief complaint on file.    ASSESSMENT AND PLAN:   [name] 58 year old male with CHF with reduced LVEF, nonischemic cardiomyopathy with reduced LVEF 30% and normal coronary angiogram September 2016 at [facility], now improved LVEF to 40 to 45% by last echocardiogram July 2024, diabetes mellitus, hypertension, hyperlipidemia, seizure disorder/epilepsy, chronic right hip pain, smokes recreational marijuana, drinks alcohol socially on rare occasions, seen today for follow-up visit   Problem List Items Addressed This Visit     Non-ischemic cardiomyopathy (HCC) (Chronic)   Nonischemic cardiomyopathy diagnosed in September 2016 at [facility]. LVEF 30% at the time, most recent echocardiogram from July 2024 at [facility] LVEF 40 to 45%, global hypokinesis.  On guideline directed medical therapy as reviewed under CHF below with metoprolol  XL, Farxiga , Entresto , spironolactone .  Advised to avoid alcohol.  He is rarely drinks on social occasions.       Relevant Medications   spironolactone  (ALDACTONE ) 25 MG tablet   sacubitril -valsartan  (ENTRESTO ) 97-103 MG   aspirin  EC 81 MG tablet   FARXIGA  10 MG TABS tablet   metoprolol  succinate (TOPROL  XL) 25 MG 24 hr tablet   Other Relevant Orders   EKG 12-Lead (Completed)   Essential hypertension (Chronic)   Uncontrolled today. Target below 130 over 80 mmHg. He has been off metoprolol  for about a month and and Entresto  and spironolactone  for couple days. Refills being sent advised him to resume and monitor blood pressures.      Relevant Medications   spironolactone  (ALDACTONE ) 25 MG tablet   sacubitril -valsartan  (ENTRESTO ) 97-103 MG   aspirin  EC 81 MG tablet   metoprolol  succinate (TOPROL   XL) 25 MG 24 hr tablet   CHF (congestive heart failure) (HCC) - Primary   Euvolemic and compensated.  NYHA I, functional status limited due to right hip pain which seems to be well-controlled using Celebrex at this time. Advised about low-salt diet, below 2 g/day and keep fluid intake below 2 L/day. Advised him to monitor his weights consistently for any weight gain more than 2 to 3 pounds within a day or 4 pounds within a week should notify us .  Continue with furosemide  40 mg once daily.  Continue guideline directed medical therapy. Will transition from metoprolol  to tartrate to metoprolol  succinate 25 mg once daily, new prescription being sent. Will send refill prescription for Entresto  97-103 mg twice daily. Will send out a prescription refill for spironolactone  25 mg once daily Titrate up dapagliflozin  [Farxiga ] dose to 10 mg once daily.  Will recheck blood work with basic metabolic panel in couple weeks to check his elect lites and kidney function.      Relevant Medications   spironolactone  (ALDACTONE ) 25 MG tablet   sacubitril -valsartan  (ENTRESTO ) 97-103 MG   aspirin  EC 81 MG tablet   FARXIGA  10 MG TABS tablet   metoprolol  succinate (TOPROL  XL) 25 MG 24 hr tablet   Other Relevant Orders   Basic metabolic panel   Hyperlipidemia   Continue atorvastatin  20 mg once daily. Lipid panel from 12-03-2023 LDL 87, HDL 44, total cholesterol 413, triglycerides 73.      Relevant Medications   spironolactone  (ALDACTONE ) 25 MG tablet   sacubitril -valsartan  (ENTRESTO ) 97-103 MG   aspirin  EC 81 MG  tablet   metoprolol  succinate (TOPROL  XL) 25 MG 24 hr tablet   Return to clinic tentatively in 1 year, earlier follow-up as needed.   History of Present Illness:    Jim Garcia is a 58 y.o. male who is being seen today for the evaluation of CHF at the request of Hassie Lint, PA-C.  Previously seen by Dr. Sandee Crook at our office 07-15-2022.  History of CHF with reduced LVEF, nonischemic  cardiomyopathy with reduced LVEF 30% and normal coronary angiogram September 2016 at [facility], now improved LVEF to 40 to 45% by last echocardiogram July 2024, diabetes mellitus, hypertension, hyperlipidemia, seizure disorder [recent ambulatory EEG 11/18/2023 abnormal with mild focal cerebral dysfunction and active seizure focus over the left temporal region], chronic right hip pain, smokes recreational marijuana at least once a day, social mild alcohol consumption.  Pleasant man.  Lives with his friend at home.  Keeps himself busy with work at home and with community.  He was working at [employer] but had to be off work due to his seizure episodes.  With recent abnormal EEG monitor results, he is pending follow-up visit with neurologist and his off work and is off driving for another 6 months.  Enjoys line dancing and recently went for this couple days ago and mentions hip is slightly sore from that.  From cardiac standpoint he denies any active symptoms. Denies any chest pain, shortness of breath, orthopnea, paroxysmal nocturnal dyspnea, palpitations, lightheadedness, dizziness.  Reports ambulation is limited due to right hip pain and has been using Celebrex which has helped with the pain significantly and able to keep up with his day-to-day activities.  Mentions he is compliant with his medications and dietary salt restriction. Mentions he has out of Entresto  since yesterday. He ran out of metoprolol  tartrate about 4 weeks ago and is in the process to refill this. Has been taking Farxiga  5 mg once daily and spironolactone  25 mg once daily consistently.  Takes isosorbide  mononitrate 30 mg in the morning and some days takes an additional 30 mg in the afternoon when he knows he has to be exerting physically.  Does not smoke tobacco. Smokes marijuana once a day. Drinks alcohol on social occasions. No other illicit drugs.  EKG in the clinic today shows sinus rhythm heart  rate 57/min, sinus arrhythmia, PR interval normal 188 ms, nonspecific intraventricular conduction delay with QRS duration 134 ms and fusion complexes  Last echocardiogram available to review is from 05/18/2023 at [facility] reported moderate concentric LVH with reduced LVEF 40 to 45% global hypokinesis, grade 1 diastolic dysfunction, normal RV size and function, no significant valve abnormalities, IVC was noted to be mildly dilated with estimated RA pressure 8 mmHg.  Ascending aorta borderline mildly dilated measuring 4 cm in size with body surface area of 2.2 m.  In comparison prior echocardiogram from March 2023 reported LVEF 30 to 35%.  Past Medical History:  Diagnosis Date   Acute on chronic combined systolic and diastolic CHF, NYHA class 3 (HCC) 04/14/2018   Acute on chronic systolic heart failure (HCC) 01/26/2022   CHF (congestive heart failure) (HCC)    Chronic bilateral low back pain 10/29/2023   Edema 06/12/2015   Essential hypertension 04/15/2018   Generalized muscle weakness 05/22/2023   Hip pain, bilateral 10/29/2023   Hyperlipidemia 06/12/2015   Hypokalemia    Morbid obesity due to excess calories (HCC) 06/13/2015   Non-ischemic cardiomyopathy (HCC) 04/15/2018   Obesity (BMI 30.0-34.9) 11/24/2018  Sleep apnea, obstructive 04/15/2018   Stage 2 hypertension 01/26/2022   Syncopal episodes 01/26/2022   Type 2 diabetes mellitus (HCC) 07/24/2015   Type 2 diabetes mellitus without complication (HCC) 07/24/2015    Past Surgical History:  Procedure Laterality Date   NO PAST SURGERIES      Current Medications: Current Meds  Medication Sig   atorvastatin  (LIPITOR) 20 MG tablet TAKE 1 TABLET(20 MG) BY MOUTH DAILY   celecoxib (CELEBREX) 200 MG capsule Take 200 mg by mouth daily.   dapagliflozin  propanediol (FARXIGA ) 5 MG TABS tablet Take 1 tablet (5 mg total) by mouth daily.   divalproex (DEPAKOTE) 250 MG DR tablet 750mg  am and 1000mg  pm   FARXIGA  10 MG TABS tablet Take 1  tablet (10 mg total) by mouth daily.   furosemide  (LASIX ) 40 MG tablet Take 1 tablet (40 mg total) by mouth daily.   hydrALAZINE  (APRESOLINE ) 50 MG tablet TAKE 1 TABLET(50 MG) BY MOUTH THREE TIMES DAILY   isosorbide  mononitrate (IMDUR ) 30 MG 24 hr tablet Take 2 tablets (60 mg total) by mouth daily.   levETIRAcetam (KEPPRA) 750 MG tablet Take 1,500 mg by mouth 2 (two) times daily.   metoprolol  succinate (TOPROL  XL) 25 MG 24 hr tablet Take 1 tablet (25 mg total) by mouth daily.   potassium chloride  SA (KLOR-CON  M) 20 MEQ tablet Take 1 tablet (20 mEq total) by mouth daily. Final attempt, patient needs and appt for additional refills   predniSONE  (DELTASONE ) 20 MG tablet Take 1 tablet (20 mg total) by mouth daily with breakfast.   TRUEPLUS SAFETY LANCETS 28G MISC 1 Stick by Does not apply route 3 (three) times daily.   [DISCONTINUED] aspirin  EC 81 MG tablet Take 81 mg by mouth daily.   [DISCONTINUED] metoprolol  tartrate (LOPRESSOR ) 25 MG tablet TAKE 1/2 TABLET(12.5 MG) BY MOUTH TWICE DAILY   [DISCONTINUED] sacubitril -valsartan  (ENTRESTO ) 97-103 MG Take 1 tablet by mouth 2 (two) times daily. 1rst attempt, patient needs and appt for additional refills   [DISCONTINUED] spironolactone  (ALDACTONE ) 25 MG tablet Take 1 tablet (25 mg total) by mouth daily.     Allergies:   Patient has no known allergies.   Social History   Socioeconomic History   Marital status: Single    Spouse name: Not on file   Number of children: 2   Years of education: Not on file   Highest education level: High school graduate  Occupational History   Not on file  Tobacco Use   Smoking status: Former   Smokeless tobacco: Never   Tobacco comments:    quit 15 years ago  Vaping Use   Vaping status: Never Used  Substance and Sexual Activity   Alcohol use: Yes    Comment: occ   Drug use: Yes    Types: Marijuana    Comment: 04/08/21 almost daily   Sexual activity: Not on file  Other Topics Concern   Not on file  Social  History Narrative   Not on file   Social Drivers of Health   Financial Resource Strain: High Risk (12/08/2023)   Overall Financial Resource Strain (CARDIA)    Difficulty of Paying Living Expenses: Very hard  Food Insecurity: Food Insecurity Present (12/08/2023)   Hunger Vital Sign    Worried About Running Out of Food in the Last Year: Often true    Ran Out of Food in the Last Year: Often true  Transportation Needs: Not on file (05/17/2023)  Physical Activity: Inactive (12/07/2023)   Exercise Vital  Sign    Days of Exercise per Week: 0 days    Minutes of Exercise per Session: 0 min  Stress: No Stress Concern Present (12/07/2023)   [employer] of Occupational Health - Occupational Stress Questionnaire    Feeling of Stress : Not at all  Social Connections: Not on file     Family History: The patient's family history includes Diabetes in his father and mother; Hypertension in his mother. ROS:   Please see the history of present illness.    All 14 point review of systems negative except as described per history of present illness.  EKGs/Labs/Other Studies Reviewed:    The following studies were reviewed today: Cardiac Diagnostic Report   Demographics   Patient     Valiente Mechel  Date of    [birth year]  Height   Name                       Birth   Patient     [id number]   Age        87 year(s)  Weight   Number   Visit       [id number]    Gender     Male        BSA   Number   Accession   [id number]  Race       Unknown     BMI   Number                              Room       Periop [facility] Date of Study  07/26/2015                              Number   Referring   Donold Galla,  Diagnostic Darryl      Interventional Darryl   Physician   MD             Physician  Stanton Earthly, MD   Physician      Stanton Earthly, MD  Procedure  Procedure Type   Diagnostic procedure: Left Heart Cath  Complications: No Complications.  Conclusions  Diagnostic Procedure Summary  There is NO obstructive CAD.  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  Severe global LV dysfunction.EF 30%  Diagnostic Procedure Recommendations  Medical Therapy because no interventional therapy is required.  Medical therapy for LV dysfunction.   Signatures   Electronically signed by Donold Galla, MD(Diagnostic Physician)   on 07/26/2015 08:44   Angiographic findings   Cardiac Arteries and Lesion Findings  LMCA: Normal.  LAD: Normal.  LCx: Normal.  RCA: Aneurysmal.  Ramus: Normal.  Procedure Data  Procedure Date  Date: 07/26/2015 Start: 08:27  Contrast Material    - Omnipaque88 ml  Fluoroscopy Time: Diagnostic: 2:05 minutes. Total: 2:05 minutes.  Admission Data  Admission Date: 07/26/2015  Admission Status: Outpatient.  VA  LV function assessed ON:[id number].  Ejection Fraction    - Method: LV gram. EF%: 30.   LVA Segment Contractility   1 - Normal       3 - Mild        5 - Severe      7 - Dyskinesis   hypokinesis     hypokinesis   2 - Hypokinesis  4 - Moderate    6 - Akinesis    8 - Aneurysm   hypokinesis   Snapshots  Hemodynamics   Condition: Rest   Heart Rate: 59 bpm  Pressures  +-----+------------+  !Site !Pressure    !  +-----+------------+  !AO  !143/93 (115)!  +-----+------------+  !LV  !175/-3 ,-4  !  +-----+------------+  !AO  !173/91 (122)!  +-----+------------+  !LV  !164/1 ,8    !  +-----+------------+  Valve Gradients and Areas  +------+----+----+----+-----+----+------+  !Valve !Peak!Mean!Area!Index!Flow!Source!  +------+----+----+----+-----+----+------+  !Aortic!0  !0   !    !     !    !      !  +------+----+----+----+-----+----+------+  !Aortic!0  !0   !    !     !    !      !  +------+----+----+----+-----+----+------+   EKG:  EKG Interpretation Date/Time:  Monday December 14 2023 09:51:05 EST Ventricular Rate:  57 PR Interval:  188 QRS Duration:  134 QT Interval:  410 QTC Calculation: 399 R Axis:   -67  Text Interpretation: Sinus bradycardia with marked sinus arrhythmia with  ventricular escape complexes Left axis deviation Non-specific intra-ventricular conduction block Minimal voltage criteria for LVH, may be normal variant ( Cornell product ) When compared with ECG of 30-Mar-2021 16:22, PREVIOUS ECG IS PRESENT Confirmed by Bertha Broad reddy [phone]) on 12/14/2023 10:15:10 AM    Recent Labs: No results found for requested labs within last 365 days.  Recent Lipid Panel    Component Value Date/Time   CHOL 145 12/03/2023 1048   TRIG 73 12/03/2023 1048   HDL 44 12/03/2023 1048   CHOLHDL 3.3 12/03/2023 1048   LDLCALC 87 12/03/2023 1048    Physical Exam:    VS:  BP (!) 140/88   Pulse (!) 57   Ht 6\' 3"  (1.905 m)   Wt 230 lb 12.8 oz (104.7 kg)   SpO2 96%   BMI 28.85 kg/m     Wt Readings from Last 3 Encounters:  12/14/23 230 lb 12.8 oz (104.7 kg)  12/03/23 225 lb 6.4 oz (102.2 kg)  09/15/22 227 lb (103 kg)     GENERAL:  Well nourished, well developed in no acute distress NECK: No JVD; No carotid bruits CARDIAC: RRR, S1 and S2 present, no murmurs, no rubs, no gallops CHEST:  Clear to auscultation without rales, wheezing or rhonchi  Extremities: No pitting pedal edema. Pulses bilaterally symmetric with radial 2+ and dorsalis pedis 2+ NEUROLOGIC:  Alert and oriented x 3  Medication Adjustments/Labs and Tests Ordered: Current medicines are reviewed at length with the patient today.  Concerns regarding medicines are outlined above.  Orders Placed This Encounter  Procedures   Basic metabolic panel   EKG 12-Lead   Meds ordered this encounter  Medications   spironolactone  (ALDACTONE ) 25 MG tablet    Sig: Take 1 tablet (25 mg total) by mouth daily.    Dispense:  90 tablet    Refill:  3   sacubitril -valsartan  (ENTRESTO ) 97-103 MG    Sig: Take 1 tablet by mouth 2 (two) times daily.    Dispense:  180 tablet    Refill:  3   aspirin  EC 81 MG tablet    Sig: Take 1 tablet (81 mg total) by mouth daily.    Dispense:  90 tablet    Refill:  3    FARXIGA  10 MG TABS tablet    Sig: Take 1 tablet (10 mg total) by mouth daily.    Dispense:  90 tablet    Refill:  3   metoprolol  succinate (TOPROL  XL) 25  MG 24 hr tablet    Sig: Take 1 tablet (25 mg total) by mouth daily.    Dispense:  90 tablet    Refill:  3    Signed, Amere Bricco reddy Catie Chiao, MD, MPH, [facility]. 12/14/2023 10:49 AM    [facility]

## 2024-01-15 ENCOUNTER — Other Ambulatory Visit: Payer: Self-pay | Admitting: Cardiology

## 2024-02-02 ENCOUNTER — Other Ambulatory Visit (HOSPITAL_COMMUNITY): Payer: Self-pay

## 2024-02-02 ENCOUNTER — Telehealth: Payer: Self-pay

## 2024-02-02 ENCOUNTER — Telehealth: Payer: Self-pay | Admitting: Pharmacy Technician

## 2024-02-02 NOTE — Telephone Encounter (Signed)
 Pharmacy Patient Advocate Encounter   Received notification from Pt Calls Messages that prior authorization for Jim Garcia is required/requested.   Insurance verification completed.   The patient is insured through [facility] .   Per test claim: PA required; PA submitted to above mentioned insurance via CoverMyMeds Key/confirmation #/EOC [id number] Status is pending

## 2024-02-02 NOTE — Telephone Encounter (Signed)
 [facility] sent in a PA request for [place]

## 2024-02-02 NOTE — Telephone Encounter (Signed)
 Pharmacy Patient Advocate Encounter  DAW 1 on script for insurance to cover  Received notification from [facility] that Prior Authorization for Jim Garcia has been APPROVED from 02/02/24 to 02/01/25. Ran test claim, Copay is $4.00- . This test claim was processed through [facility]- copay amounts may vary at other pharmacies due to pharmacy/plan contracts, or as the patient moves through the different stages of their insurance plan.   PA #/Case ID/Reference #: [id number]

## 2024-03-02 ENCOUNTER — Ambulatory Visit: Payer: No Typology Code available for payment source | Admitting: Family Medicine

## 2024-03-02 ENCOUNTER — Ambulatory Visit: Admitting: Physician Assistant

## 2024-03-10 ENCOUNTER — Ambulatory Visit: Attending: Physician Assistant | Admitting: Physician Assistant

## 2024-03-10 ENCOUNTER — Encounter: Payer: Self-pay | Admitting: Physician Assistant

## 2024-03-10 VITALS — BP 146/86 | HR 70 | Resp 19 | Ht 75.0 in | Wt 215.8 lb

## 2024-03-10 DIAGNOSIS — E782 Mixed hyperlipidemia: Secondary | ICD-10-CM | POA: Diagnosis not present

## 2024-03-10 DIAGNOSIS — I10 Essential (primary) hypertension: Secondary | ICD-10-CM

## 2024-03-10 DIAGNOSIS — K4090 Unilateral inguinal hernia, without obstruction or gangrene, not specified as recurrent: Secondary | ICD-10-CM

## 2024-03-10 DIAGNOSIS — I428 Other cardiomyopathies: Secondary | ICD-10-CM

## 2024-03-10 MED ORDER — HYDRALAZINE HCL 50 MG PO TABS: 50.0000 mg | ORAL_TABLET | Freq: Three times a day (TID) | ORAL | 1 refills | Status: DC

## 2024-03-10 MED ORDER — ATORVASTATIN CALCIUM 20 MG PO TABS: ORAL_TABLET | ORAL | 0 refills | Status: DC

## 2024-03-10 MED ORDER — ATORVASTATIN CALCIUM 20 MG PO TABS: ORAL_TABLET | ORAL | 0 refills | Status: AC

## 2024-03-10 NOTE — Progress Notes (Signed)
 Patient ID: Jim Garcia, male   DOB: [birth year], 58 y.o.   MRN: [id number]   Jim Garcia, is a 58 y.o. male  WUJ:[id number]  NFA:[id number]  DOB - [birth year]  Chief Complaint  Patient presents with   Groin Pain    Left side        Subjective:   Jim Garcia is a 58 y.o. male here today for a couple med RF and bc he felt like he pulled something in his L groin a few weeks ago.  Some pain and pressure and small bulge.  No penile discharge or dysuria.  Seen by cardiology 12/2023.  Sees neuro for seizure d/o.  Denies any other issues or concerns.    No problems updated.  ALLERGIES: No Known Allergies  PAST MEDICAL HISTORY: Past Medical History:  Diagnosis Date   Acute on chronic combined systolic and diastolic CHF, NYHA class 3 (HCC) 04/14/2018   Acute on chronic systolic heart failure (HCC) 01/26/2022   CHF (congestive heart failure) (HCC)    Chronic bilateral low back pain 10/29/2023   Edema 06/12/2015   Essential hypertension 04/15/2018   Generalized muscle weakness 05/22/2023   Hip pain, bilateral 10/29/2023   Hyperlipidemia 06/12/2015   Hypokalemia    Morbid obesity due to excess calories (HCC) 06/13/2015   Non-ischemic cardiomyopathy (HCC) 04/15/2018   Obesity (BMI 30.0-34.9) 11/24/2018   Sleep apnea, obstructive 04/15/2018   Stage 2 hypertension 01/26/2022   Syncopal episodes 01/26/2022   Type 2 diabetes mellitus (HCC) 07/24/2015   Type 2 diabetes mellitus without complication (HCC) 07/24/2015    MEDICATIONS AT HOME: Prior to Admission medications   Medication Sig Start Date End Date Taking? Authorizing Provider  aspirin  EC 81 MG tablet Take 1 tablet (81 mg total) by mouth daily. 12/14/23  Yes Madireddy, Daymon Evans, MD  BRIVIACT 100 MG TABS tablet Take 100 mg by mouth 2 (two) times daily.   Yes [provider]  celecoxib (CELEBREX) 200 MG capsule Take 200 mg by mouth daily. 11/03/23  Yes [provider]  dapagliflozin  propanediol (FARXIGA ) 5  MG TABS tablet Take 1 tablet (5 mg total) by mouth daily. 12/03/23  Yes Dulce Gibbs M, PA-C  divalproex (DEPAKOTE) 250 MG DR tablet 750mg  am and 1000mg  pm 11/29/23  Yes [provider]  FARXIGA  10 MG TABS tablet Take 1 tablet (10 mg total) by mouth daily. 12/14/23  Yes Madireddy, Daymon Evans, MD  furosemide  (LASIX ) 40 MG tablet Take 1 tablet (40 mg total) by mouth daily. 12/03/23  Yes Dulce Gibbs M, PA-C  isosorbide  mononitrate (IMDUR ) 30 MG 24 hr tablet Take 2 tablets (60 mg total) by mouth daily. 12/03/23  Yes Jeannett Dekoning M, PA-C  metoprolol  succinate (TOPROL  XL) 25 MG 24 hr tablet Take 1 tablet (25 mg total) by mouth daily. 12/14/23  Yes Madireddy, Daymon Evans, MD  potassium chloride  SA (KLOR-CON  M) 20 MEQ tablet Take 1 tablet (20 mEq total) by mouth daily. 01/15/24  Yes Madireddy, Daymon Evans, MD  predniSONE  (DELTASONE ) 20 MG tablet Take 1 tablet (20 mg total) by mouth daily with breakfast. 06/04/22  Yes Newlin, Enobong, MD  sacubitril -valsartan  (ENTRESTO ) 97-103 MG Take 1 tablet by mouth 2 (two) times daily. 12/14/23  Yes Madireddy, Daymon Evans, MD  sertraline (ZOLOFT) 50 MG tablet Take 50 mg by mouth daily. 02/25/24  Yes [provider]  spironolactone  (ALDACTONE ) 25 MG tablet Take 1 tablet (25 mg total) by mouth daily. 12/14/23  Yes Madireddy, Daymon Evans, MD  TRUEPLUS SAFETY  LANCETS 28G MISC 1 Stick by Does not apply route 3 (three) times daily. 05/18/18  Yes Newlin, Enobong, MD  atorvastatin  (LIPITOR) 20 MG tablet TAKE 1 TABLET(20 MG) BY MOUTH DAILY 03/10/24   Dulce Gibbs M, PA-C  hydrALAZINE  (APRESOLINE ) 50 MG tablet Take 1 tablet (50 mg total) by mouth 3 (three) times daily. 03/10/24   Hassie Lint, PA-C  levETIRAcetam (KEPPRA) 750 MG tablet Take 1,500 mg by mouth 2 (two) times daily. 11/29/23 02/27/24  [provider]    ROS: Neg HEENT Neg resp Neg cardiac Neg GI Neg MS Neg psych  Objective:   Vitals:   03/10/24 0857  BP: (!) 146/86  Pulse: 70  Resp:  19  SpO2: 100%  Weight: 215 lb 12.8 oz (97.9 kg)  Height: 6\' 3"  (1.905 m)   Exam General appearance : Awake, alert, not in any distress. Speech Clear. Not toxic looking HEENT: Atraumatic and Normocephalic, pupils equally reactive to light and accomodation Neck: Supple, no JVD. No cervical lymphadenopathy.  Chest: Good air entry bilaterally, CTAB.  No rales/rhonchi/wheezing CVS: S1 S2 regular, no murmurs.  Small bulge L inguinal region with valsalva.  No genitalia exam Extremities: B/L Lower Ext shows no edema, both legs are warm to touch Neurology: Awake alert, and oriented X 3, CN II-XII intact, Non focal Skin: No Rash  Data Review Lab Results  Component Value Date   HGBA1C 5.5 12/03/2023   HGBA1C 5.8 09/15/2022   HGBA1C 6.0 06/04/2022    Assessment & Plan   1. Mixed hyperlipidemia - atorvastatin  (LIPITOR) 20 MG tablet; TAKE 1 TABLET(20 MG) BY MOUTH DAILY  Dispense: 90 tablet; Refill: 0 - Comprehensive metabolic panel  2. Non-ischemic cardiomyopathy (HCC) Followed by cardiology - atorvastatin  (LIPITOR) 20 MG tablet; TAKE 1 TABLET(20 MG) BY MOUTH DAILY  Dispense: 90 tablet; Refill: 0 - hydrALAZINE  (APRESOLINE ) 50 MG tablet; Take 1 tablet (50 mg total) by mouth 3 (three) times daily.  Dispense: 270 tablet; Refill: 1  3. Essential hypertension - hydrALAZINE  (APRESOLINE ) 50 MG tablet; Take 1 tablet (50 mg total) by mouth 3 (three) times daily.  Dispense: 270 tablet; Refill: 1 - Comprehensive metabolic panel  4. Left inguinal hernia (Primary) - Ambulatory referral to Urology    Return in about 3 months (around 06/10/2024) for PCP for chronic conditions-Newlin.  The patient was given clear instructions to go to ER or return to medical center if symptoms don't improve, worsen or new problems develop. The patient verbalized understanding. The patient was told to call to get lab results if they haven't heard anything in the next week.      Dulce Gibbs, PA-C [facility] and [facility] [place], [place] [phone]   03/10/2024, 9:44 AM

## 2024-03-10 NOTE — Patient Instructions (Signed)
Schedule an appt with cardiology

## 2024-03-11 LAB — COMPREHENSIVE METABOLIC PANEL WITH GFR
ALT: 5 IU/L (ref 0–44)
AST: 10 IU/L (ref 0–40)
Albumin: 4.6 g/dL (ref 3.8–4.9)
Alkaline Phosphatase: 54 IU/L (ref 44–121)
BUN/Creatinine Ratio: 11 (ref 9–20)
BUN: 12 mg/dL (ref 6–24)
Bilirubin Total: 0.5 mg/dL (ref 0.0–1.2)
CO2: 23 mmol/L (ref 20–29)
Calcium: 9.7 mg/dL (ref 8.7–10.2)
Chloride: 101 mmol/L (ref 96–106)
Creatinine, Ser: 1.08 mg/dL (ref 0.76–1.27)
Globulin, Total: 2.5 g/dL (ref 1.5–4.5)
Glucose: 92 mg/dL (ref 70–99)
Potassium: 4.5 mmol/L (ref 3.5–5.2)
Sodium: 143 mmol/L (ref 134–144)
Total Protein: 7.1 g/dL (ref 6.0–8.5)
eGFR: 81 mL/min/{1.73_m2} (ref >59)

## 2024-03-14 ENCOUNTER — Encounter: Payer: Self-pay | Admitting: Physician Assistant

## 2024-04-17 DIAGNOSIS — R911 Solitary pulmonary nodule: Secondary | ICD-10-CM | POA: Insufficient documentation

## 2024-04-17 DIAGNOSIS — G40209 Localization-related (focal) (partial) symptomatic epilepsy and epileptic syndromes with complex partial seizures, not intractable, without status epilepticus: Secondary | ICD-10-CM | POA: Insufficient documentation

## 2024-05-15 ENCOUNTER — Other Ambulatory Visit: Payer: Self-pay | Admitting: Family Medicine

## 2024-05-15 DIAGNOSIS — I10 Essential (primary) hypertension: Secondary | ICD-10-CM

## 2024-06-06 DIAGNOSIS — F0671 Mild neurocognitive disorder due to known physiological condition with behavioral disturbance: Secondary | ICD-10-CM | POA: Insufficient documentation

## 2024-06-10 ENCOUNTER — Telehealth: Payer: Self-pay | Admitting: Family Medicine

## 2024-06-10 NOTE — Telephone Encounter (Signed)
 Pt confirmed appt 8/8

## 2024-06-13 ENCOUNTER — Encounter: Payer: Self-pay | Admitting: Family Medicine

## 2024-06-13 ENCOUNTER — Telehealth: Payer: Self-pay

## 2024-06-13 ENCOUNTER — Ambulatory Visit: Attending: Family Medicine | Admitting: Family Medicine

## 2024-06-13 VITALS — BP 117/68 | HR 57 | Ht 75.0 in | Wt 220.0 lb

## 2024-06-13 DIAGNOSIS — L0292 Furuncle, unspecified: Secondary | ICD-10-CM

## 2024-06-13 DIAGNOSIS — I428 Other cardiomyopathies: Secondary | ICD-10-CM

## 2024-06-13 DIAGNOSIS — Z7984 Long term (current) use of oral hypoglycemic drugs: Secondary | ICD-10-CM

## 2024-06-13 DIAGNOSIS — G40909 Epilepsy, unspecified, not intractable, without status epilepticus: Secondary | ICD-10-CM | POA: Insufficient documentation

## 2024-06-13 DIAGNOSIS — E1159 Type 2 diabetes mellitus with other circulatory complications: Secondary | ICD-10-CM | POA: Diagnosis not present

## 2024-06-13 DIAGNOSIS — E1169 Type 2 diabetes mellitus with other specified complication: Secondary | ICD-10-CM | POA: Diagnosis not present

## 2024-06-13 DIAGNOSIS — L304 Erythema intertrigo: Secondary | ICD-10-CM

## 2024-06-13 DIAGNOSIS — E785 Hyperlipidemia, unspecified: Secondary | ICD-10-CM

## 2024-06-13 DIAGNOSIS — K4090 Unilateral inguinal hernia, without obstruction or gangrene, not specified as recurrent: Secondary | ICD-10-CM

## 2024-06-13 DIAGNOSIS — Z1211 Encounter for screening for malignant neoplasm of colon: Secondary | ICD-10-CM

## 2024-06-13 DIAGNOSIS — I152 Hypertension secondary to endocrine disorders: Secondary | ICD-10-CM

## 2024-06-13 HISTORY — DX: Epilepsy, unspecified, not intractable, without status epilepticus: G40.909

## 2024-06-13 LAB — POCT GLYCOSYLATED HEMOGLOBIN (HGB A1C): HbA1c, POC (controlled diabetic range): 5.5 % (ref 0.0–7.0)

## 2024-06-13 MED ORDER — CLOTRIMAZOLE 1 % EX CREA: 1.0000 | TOPICAL_CREAM | Freq: Two times a day (BID) | CUTANEOUS | 0 refills | Status: AC

## 2024-06-13 MED ORDER — ISOSORBIDE MONONITRATE ER 30 MG PO TB24: 60.0000 mg | ORAL_TABLET | Freq: Every day | ORAL | 1 refills | Status: AC

## 2024-06-13 NOTE — Telephone Encounter (Signed)
 Per Dr. Liborio:  He has nonischemic cardiomyopathy with a normal QTc on his last EKG at office visit in February with me.  From cardiac standpoint okay to use either medication and continue to follow-up EKGs to monitor QT interval.  Please ensure there are no drug interactions with his current medications as appropriate by prescribing provider or the clinical pharmacist.  Thank you.

## 2024-06-13 NOTE — Patient Instructions (Signed)
 VISIT SUMMARY:  During your visit, we discussed your concerns about a hernia, a boil, and a rash. We also reviewed your ongoing management for seizures, heart failure, and diabetes. We addressed each issue and provided recommendations for your care.  YOUR PLAN:  -LEFT INGUINAL HERNIA: A left inguinal hernia is a condition where tissue pushes through a weak spot in the groin area, causing pain and discomfort. We will refer you to a general surgeon for further evaluation and management.  -BOIL OF PERINEAL REGION: A boil is a painful, pus-filled bump under the skin caused by infected hair follicles. You should apply warm compresses and soak in warm water with Epsom salt to help it heal.  -INTERTRIGO, RESOLVED, AT RISK FOR RECURRENCE: Intertrigo is a rash in skin folds caused by moisture and friction. Although it has resolved, you are at risk for recurrence. Use clotrimazole  cream if it returns and wear loose clothing to improve air circulation.  -TYPE 2 DIABETES MELLITUS WITH OTHER SPECIFIED COMPLICATION: Type 2 diabetes is a condition where your body does not use insulin properly. Your diabetes is well-controlled with an A1c of 5.5%. Continue taking Farxiga  at 10 mg daily and follow your current management plan.  -ACUTE ON CHRONIC COMBINED SYSTOLIC AND DIASTOLIC HEART FAILURE, NYHA CLASS 3: Heart failure is a condition where the heart does not pump blood as well as it should. Continue taking your medications as prescribed, and ensure you are clear on the dosages, especially for Isosorbide .  -SEIZURE DISORDER: A seizure disorder is a condition where you experience seizures due to abnormal electrical activity in the brain. Continue working with your neurologist to manage your medications and coordinate care with your cardiologist.  INSTRUCTIONS:  Please follow up with a general surgeon for your hernia evaluation. Continue your current diabetes and heart failure management plans, and ensure you are  clear on your medication dosages. Coordinate with your neurologist and cardiologist for your seizure and heart failure management. If you have any questions or concerns, please contact our office.

## 2024-06-13 NOTE — Progress Notes (Signed)
 Subjective:  Patient ID: Jim Garcia, male    DOB: 1966-03-15  Age: 58 y.o. MRN: [id number]  CC: Medical Management of Chronic Issues (Discuss hernia/Rash under abdomen/Boil on buttocks)     Discussed the use of AI scribe software for clinical note transcription with the patient, who gave verbal consent to proceed.  History of Present Illness Jim Garcia is a 58 year old male with a history of hypertension, nonischemic cardiomyopathy (EF 35 to 40% in 11/2018), Type II DM, Seizures  who presents with concerns about a hernia, a boil, and a rash.  He has a left-sided hernia with intermittent burning pain and no associated gastrointestinal symptoms. He was referred to a specialist but did not receive an appointment notification.  A boil is present on his buttock, under the scrotum and between the leg, appearing 3-4 days ago. It may be getting smaller, and he is unsure if it is draining.  A rash under his abdomen is attributed to sweating and skin folds, with itching and scabs. He uses cortisone cream for itching.  He has seizures and is not allowed to drive. His neurologist is adjusting his medication. He has heart failure and is under cardiology care, last seen in February. He is unsure of his next appointment as he has memory difficulties.  Doses adherence with his statin, his antihypertensive and cardiac medications.    Past Medical History:  Diagnosis Date   Acute on chronic combined systolic and diastolic CHF, NYHA class 3 (HCC) 04/14/2018   Acute on chronic systolic heart failure (HCC) 01/26/2022   CHF (congestive heart failure) (HCC)    Chronic bilateral low back pain 10/29/2023   Edema 06/12/2015   Essential hypertension 04/15/2018   Generalized muscle weakness 05/22/2023   Hip pain, bilateral 10/29/2023   Hyperlipidemia 06/12/2015   Hypokalemia    Morbid obesity due to excess calories (HCC) 06/13/2015   Non-ischemic cardiomyopathy (HCC) 04/15/2018   Obesity (BMI  30.0-34.9) 11/24/2018   Sleep apnea, obstructive 04/15/2018   Stage 2 hypertension 01/26/2022   Syncopal episodes 01/26/2022   Type 2 diabetes mellitus (HCC) 07/24/2015   Type 2 diabetes mellitus without complication (HCC) 07/24/2015    Past Surgical History:  Procedure Laterality Date   NO PAST SURGERIES      Family History  Problem Relation Age of Onset   Diabetes Mother    Hypertension Mother    Diabetes Father     Social History   Socioeconomic History   Marital status: Single    Spouse name: Not on file   Number of children: 2   Years of education: Not on file   Highest education level: 12th grade  Occupational History   Not on file  Tobacco Use   Smoking status: Former   Smokeless tobacco: Never   Tobacco comments:    quit 15 years ago  Vaping Use   Vaping status: Never Used  Substance and Sexual Activity   Alcohol use: Yes    Comment: occ   Drug use: Yes    Types: Marijuana    Comment: 04/08/21 almost daily   Sexual activity: Not on file  Other Topics Concern   Not on file  Social History Narrative   Not on file   Social Drivers of Health   Financial Resource Strain: Low Risk  (06/07/2024)   Overall Financial Resource Strain (CARDIA)    Difficulty of Paying Living Expenses: Not very hard  Food Insecurity: Patient Declined (06/07/2024)   Hunger Vital Sign  Worried About Programme researcher, broadcasting/film/video in the Last Year: Patient declined    Barista in the Last Year: Patient declined  Transportation Needs: No Transportation Needs (06/07/2024)   PRAPARE - Administrator, Civil Service (Medical): No    Lack of Transportation (Non-Medical): No  Physical Activity: Inactive (06/07/2024)   Exercise Vital Sign    Days of Exercise per Week: 0 days    Minutes of Exercise per Session: Not on file  Stress: Stress Concern Present (06/07/2024)   [employer] of Occupational Health - Occupational Stress Questionnaire    Feeling of Stress: To some extent   Social Connections: Moderately Isolated (06/07/2024)   Social Connection and Isolation Panel    Frequency of Communication with Friends and Family: Three times a week    Frequency of Social Gatherings with Friends and Family: More than three times a week    Attends Religious Services: More than 4 times per year    Active Member of Clubs or Organizations: No    Attends Engineer, structural: Not on file    Marital Status: Divorced    No Known Allergies  Outpatient Medications Prior to Visit  Medication Sig Dispense Refill   aspirin  EC 81 MG tablet Take 1 tablet (81 mg total) by mouth daily. 90 tablet 3   atorvastatin  (LIPITOR) 20 MG tablet TAKE 1 TABLET(20 MG) BY MOUTH DAILY 90 tablet 0   BRIVIACT 100 MG TABS tablet Take 100 mg by mouth 2 (two) times daily.     celecoxib (CELEBREX) 200 MG capsule Take 200 mg by mouth daily.     divalproex (DEPAKOTE) 250 MG DR tablet 750mg  am and 1000mg  pm     FARXIGA  10 MG TABS tablet Take 1 tablet (10 mg total) by mouth daily. 90 tablet 3   furosemide  (LASIX ) 40 MG tablet Take 1 tablet (40 mg total) by mouth daily. 90 tablet 1   hydrALAZINE  (APRESOLINE ) 50 MG tablet Take 1 tablet (50 mg total) by mouth 3 (three) times daily. 270 tablet 1   levETIRAcetam (KEPPRA) 750 MG tablet Take 1,500 mg by mouth 2 (two) times daily.     metoprolol  succinate (TOPROL  XL) 25 MG 24 hr tablet Take 1 tablet (25 mg total) by mouth daily. 90 tablet 3   potassium chloride  SA (KLOR-CON  M) 20 MEQ tablet Take 1 tablet (20 mEq total) by mouth daily. 90 tablet 2   predniSONE  (DELTASONE ) 20 MG tablet Take 1 tablet (20 mg total) by mouth daily with breakfast. 5 tablet 0   sacubitril -valsartan  (ENTRESTO ) 97-103 MG Take 1 tablet by mouth 2 (two) times daily. 180 tablet 3   sertraline (ZOLOFT) 50 MG tablet Take 50 mg by mouth daily.     spironolactone  (ALDACTONE ) 25 MG tablet Take 1 tablet (25 mg total) by mouth daily. 90 tablet 3   TRUEPLUS SAFETY LANCETS 28G MISC 1 Stick by  Does not apply route 3 (three) times daily. 100 each 3   dapagliflozin  propanediol (FARXIGA ) 5 MG TABS tablet Take 1 tablet (5 mg total) by mouth daily. 30 tablet 4   isosorbide  mononitrate (IMDUR ) 30 MG 24 hr tablet TAKE 2 TABLETS(60 MG) BY MOUTH DAILY 60 tablet 2   No facility-administered medications prior to visit.     ROS Review of Systems  Constitutional:  Negative for activity change and appetite change.  HENT:  Negative for sinus pressure and sore throat.   Respiratory:  Negative for chest tightness, shortness of  breath and wheezing.   Cardiovascular:  Negative for chest pain and palpitations.  Gastrointestinal:  Negative for abdominal distention, abdominal pain and constipation.  Genitourinary: Negative.   Musculoskeletal: Negative.   Skin:  Positive for rash.  Psychiatric/Behavioral:  Negative for behavioral problems and dysphoric mood.     Objective:  BP 117/68   Pulse (!) 57   Ht 6' 3 (1.905 m)   Wt 220 lb (99.8 kg)   SpO2 97%   BMI 27.50 kg/m      06/13/2024    9:12 AM 03/10/2024    8:57 AM 12/14/2023   10:48 AM  BP/Weight  Systolic BP 117 146 140  Diastolic BP 68 86 88  Wt. (Lbs) 220 215.8   BMI 27.5 kg/m2 26.97 kg/m2       Physical Exam Constitutional:      Appearance: He is well-developed.  Cardiovascular:     Rate and Rhythm: Bradycardia present.     Heart sounds: Normal heart sounds. No murmur heard. Pulmonary:     Effort: Pulmonary effort is normal.     Breath sounds: Normal breath sounds. No wheezing or rales.  Chest:     Chest wall: No tenderness.  Abdominal:     General: Bowel sounds are normal. There is no distension.     Palpations: Abdomen is soft. There is no mass.     Tenderness: There is no abdominal tenderness.  Musculoskeletal:        General: Normal range of motion.     Right lower leg: No edema.     Left lower leg: No edema.  Skin:    Comments: Small boil on inferior medial aspect of right buttock Healed scars in inferior  aspect of anterior abdominal wall  Neurological:     Mental Status: He is alert and oriented to person, place, and time.  Psychiatric:        Mood and Affect: Mood normal.        Latest Ref Rng & Units 03/10/2024   10:02 AM 09/15/2022    4:07 PM 06/04/2022   10:38 AM  CMP  Glucose 70 - 99 mg/dL 92  94  93   BUN 6 - 24 mg/dL 12  13  12    Creatinine 0.76 - 1.27 mg/dL 8.91  8.85  9.01   Sodium 134 - 144 mmol/L 143  142  142   Potassium 3.5 - 5.2 mmol/L 4.5  4.2  3.4   Chloride 96 - 106 mmol/L 101  102  101   CO2 20 - 29 mmol/L 23  25  22    Calcium 8.7 - 10.2 mg/dL 9.7  9.3  9.4   Total Protein 6.0 - 8.5 g/dL 7.1  6.9    Total Bilirubin 0.0 - 1.2 mg/dL 0.5  0.3    Alkaline Phos 44 - 121 IU/L 54  85    AST 0 - 40 IU/L 10  8    ALT 0 - 44 IU/L 5  8      Lipid Panel     Component Value Date/Time   CHOL 145 12/03/2023 1048   TRIG 73 12/03/2023 1048   HDL 44 12/03/2023 1048   CHOLHDL 3.3 12/03/2023 1048   LDLCALC 87 12/03/2023 1048    CBC    Component Value Date/Time   WBC 8.0 03/30/2021 1513   RBC 5.09 03/30/2021 1513   HGB 15.3 03/30/2021 1523   HCT 45.0 03/30/2021 1523   PLT 220 03/30/2021 1513  MCV 91.2 03/30/2021 1513   MCH 29.9 03/30/2021 1513   MCHC 32.8 03/30/2021 1513   RDW 14.0 03/30/2021 1513   LYMPHSABS 1.9 03/30/2021 1513   MONOABS 0.5 03/30/2021 1513   EOSABS 0.0 03/30/2021 1513   BASOSABS 0.0 03/30/2021 1513    Lab Results  Component Value Date   HGBA1C 5.5 06/13/2024       Assessment & Plan Left inguinal hernia Left inguinal hernia with intermittent pain and burning sensation. Referral to a specialist was not completed due to communication issues. No evidence of obstructive symptoms - Refer to a general surgeon for evaluation and management.  Boil of perineal region/furuncle Boil on buttock near scrotum, present for 3-4 days, no active drainage, not increasing in size. - Advise warm compress application. - Recommend soaking in warm water  with Epsom salt.  Intertrigo, resolved, at risk for recurrence Resolved intertrigo under abdomen with scabs. Risk of recurrence due to skin folds and moisture retention. Previous cortisone use inappropriate; antifungal recommended. - Prescribe clotrimazole cream for recurrence. - Advise wearing loose clothing for air circulation.  Type 2 diabetes mellitus with other specified complication Type 2 diabetes well-controlled, A1c 5.5%. - Continue current diabetes management regimen. - Ensure Doreen is taken at 10 mg daily.  Nonischemic cardiomyopathy EF 35 to 40% from echo of 2020 Heart failure management ongoing with multiple medications.  - Ensure Isosorbide is taken as prescribed, -He is due for repeat echo - Will defer to cardiology  Seizure disorder Seizure disorder under neurology management. Reports memory issues and difficulty coordinating care between cardiology and neurology. - Encourage coordination between cardiologist and neurologist for medication management.  Hypertension associated with type 2 diabetes - Controlled -Counseled on blood pressure goal of less than 130/80, low-sodium, DASH diet, medication compliance, 150 minutes of moderate intensity exercise per week. Discussed medication compliance, adverse effects. - Continue antihypertensive  Hyperlipidemia associated with type 2 diabetes - Controlled - Continue statin - Low-cholesterol diet   Healthcare maintenance/screening for colon cancer Up-to-date on prostate cancer screening from the [facility] in 04/2024 Referred for colonoscopy  Meds ordered this encounter  Medications   clotrimazole (LOTRIMIN) 1 % cream    Sig: Apply 1 Application topically 2 (two) times daily.    Dispense:  30 g    Refill:  0   isosorbide mononitrate (IMDUR) 30 MG 24 hr tablet    Sig: Take 2 tablets (60 mg total) by mouth daily.    Dispense:  180 tablet    Refill:  1    Follow-up: Return in about 6 months (around 12/14/2024)  for Chronic medical conditions.       Corrina Sabin, MD, FAAFP. [facility] [place], [place] [phone]   06/13/2024, 1:30 PM

## 2024-06-16 LAB — MICROALBUMIN / CREATININE URINE RATIO
Creatinine, Urine: 57.2 mg/dL
Microalb/Creat Ratio: <5 mg/g{creat} (ref 0–29)
Microalbumin, Urine: <3 ug/mL

## 2024-06-17 ENCOUNTER — Ambulatory Visit: Payer: Self-pay | Admitting: Family Medicine

## 2024-06-21 NOTE — Telephone Encounter (Signed)
 Contacted pt, verified pt's name and DOB.  Informed him of provider's message.  He states that he is scheduled to have his blood drawn today.  He also has questions about his recent EEG and driving.  He states that he was informed that he should not drive due to the seizures he has been having.  However, he is wanting to know more information about the types of seizures that he has been having because he states that he does not lose awareness so he doesn't even know he is having them.  He also states that he has a follow up appointment with his cardiologist later this year.

## 2024-06-21 NOTE — Telephone Encounter (Signed)
 Noted! Thank you

## 2024-07-05 NOTE — Progress Notes (Signed)
 History of Present Illness The patient is a 58 year old male who presents for evaluation of an inguinal hernia.  He first noticed a small bulge in his left groin area approximately 1.5 to 2 months ago, which has since slightly enlarged. He describes the sensation as similar to a ping-pong ball and can feel it when lying down or sitting in certain positions. He is able to manually manipulate the bulge, but it is currently not palpable. The last time he felt the bulge was about a week and a half ago.  He describes it as a burning pain.  It is exacerbated by lifting or bending activities.  He has no history of hernia repairs or other surgeries. He is not currently taking any blood thinners  He has a history of seizures, which are severe enough that he could potentially have one during this conversation.  He is scheduled for a colonoscopy and is interested in having it performed here.    Review of Systems  Constitutional: Negative.  HENT: Negative.    Eyes: Negative.   Cardiovascular: Negative.   Respiratory:  Negative for cough.   Endocrine: Negative.   Hematologic/Lymphatic: Negative.   Skin: Negative.   Musculoskeletal: Negative.   Gastrointestinal: Negative.   Genitourinary: Negative.   Neurological: Negative.   Psychiatric/Behavioral:  Negative for altered mental status.      Medical History[1] Surgical History[2] - Never  Current Medications[3]  Allergies[4]   Social History   Socioeconomic History  . Marital status: Divorced    Spouse name: Not on file  . Number of children: Not on file  . Years of education: Not on file  . Highest education level: Not on file  Occupational History  . Not on file  Tobacco Use  . Smoking status: Never  . Smokeless tobacco: Never  Substance and Sexual Activity  . Alcohol use: Not on file  . Drug use: Yes    Types: Marijuana  . Sexual activity: Not on file  Other Topics Concern  . Not on file  Social History Narrative  . Not  on file   Social Drivers of Health   Food Insecurity: Patient Declined (06/07/2024)   Received from Jim Garcia   Food vital sign   . Within the past 12 months, you worried that your food would run out before you got money to buy more: Patient declined   . Within the past 12 months, the food you bought just didn't last and you didn't have money to get more: Patient declined  Transportation Needs: No Transportation Needs (06/07/2024)   Received from [name] - Transportation   . In the past 12 months, has lack of transportation kept you from medical appointments or from getting medications?: No   . In the past 12 months, has lack of transportation kept you from meetings, work, or from getting things needed for daily living?: No  Safety: Low Risk  (05/17/2023)   Safety   . How often does anyone, including family and friends, physically hurt you?: Never   . How often does anyone, including family and friends, insult or talk down to you?: Never   . How often does anyone, including family and friends, threaten you with harm?: Never   . How often does anyone, including family and friends, scream or curse at you?: Never  Living Situation: Low Risk  (06/07/2024)   Received from [name] Situation   . In the last 12 months, was there  a time when you were not able to pay the mortgage or rent on time?: No   . In the past 12 months, how many times have you moved where you were living?: 0   . At any time in the past 12 months, were you homeless or living in a shelter (including now)?: No    Family History[5]    Physical Exam Constitutional:      Appearance: Normal appearance.  Eyes:     Pupils: Pupils are equal, round, and reactive to light.  Cardiovascular:     Rate and Rhythm: Normal rate.  Abdominal:     General: Abdomen is flat. There is no distension.     Palpations: Abdomen is soft.     Tenderness: There is no abdominal tenderness.     Hernia: A hernia (small left  inguinal hernia, no right inguinal hernia, no ventral hernias) is present.  Skin:    General: Skin is warm and dry.  Neurological:     Mental Status: He is alert and oriented to person, place, and time.  Psychiatric:        Mood and Affect: Mood normal.      IMAGING: I personally reviewed the images and report of CT abdomen/pelvis from 05/2023: Tiny umbilical hernia. No inguinal hernias    Assessment & Plan 1. Inguinal hernia. Would plan on robotic repair with mesh and day Garcia stay, given severe seizure disorder.  He expressed a preference to postpone the surgery at this time. Monitoring instructions were provided, advising to contact the office if the hernia enlarges, becomes more painful, or cannot be manually reduced.  2. Colonoscopy. A colonoscopy is scheduled as ordered by his primary care physician, Dr. Newlin. He will be contacted soon to arrange the appointment.  Follow-up A follow-up appointment is scheduled for 6 months from now.   Electronically signed by: [name], MD 07/05/2024 3:24 PM        [1] Past Medical History: Diagnosis Date  . Diabetes mellitus    (CMD)   . Hypertension   . Seizures    (CMD)   . Syncope   [2] No past surgical history on file. [3] Current Outpatient Medications  Medication Sig Dispense Refill  . aspirin  81 mg EC tablet Take 81 mg by mouth Once Daily.    . atorvastatin  (LIPITOR) 20 mg tablet Take 20 mg by mouth Once Daily.    . brivaracetam (Briviact) 100 mg tab tablet Take 1/2 tablet bid for 1 day then increase to 1 pill bid 60 tablet 5  . celecoxib (CeleBREX) 200 mg capsule TAKE 1 CAPSULE(200 MG) BY MOUTH DAILY 30 capsule 0  . dapagliflozin  propanediol (FARXIGA ) 5 mg tab tablet Take 10 mg by mouth daily. (Patient not taking: Reported on 04/15/2024)    . divalproex (DEPAKOTE DR) 250 mg 12 hr tablet Take 3 tablets (750 mg total) by mouth 2 (two) times a day. 180 tablet 11  . Entresto  97-103 mg per tablet Take 1 tablet by  mouth 2 (two) times a day.    . furosemide  (LASIX ) 40 mg tablet Take 40 mg by mouth 2 (two) times a day for 30 days. (Patient taking differently: Take 40 mg by mouth daily.) 60 tablet 0  . hydrALAZINE  (APRESOLINE ) 50 mg tablet Take 1 tablet (50 mg total) by mouth 3 (three) times a day Indications: chronic heart failure, high blood pressure. 270 tablet 0  . isosorbide  mononitrate (IMDUR ) 30 mg 24 hr tablet Take 60 mg by  mouth Once Daily for 90 days. 60 tablet 2  . metoprolol  tartrate (LOPRESSOR ) 25 mg tablet Take 12.5 mg by mouth 2 (two) times a day for 30 days. 30 tablet 0  . midazolam 5 mg/spray (0.1 mL) spry Administer 0.1 mL (5 mg total) into affected nostril(s) once as needed (1 spray in 1 nostril for a CONVULSIVE lasting more than and call 911.). 2 each 1  . potassium chloride  20 mEq ER tablet Take 20 mEq by mouth daily.    . sertraline (ZOLOFT) 50 mg tablet Take 1/2 pill daily for 1 week then increase to 1 pill daily 30 tablet 6   No current facility-administered medications for this visit.  [4] Allergies Allergen Reactions  . Metformin  Itching  [5] No family history on file.

## 2024-08-12 ENCOUNTER — Other Ambulatory Visit: Payer: Self-pay | Admitting: Family Medicine

## 2024-08-12 DIAGNOSIS — I5043 Acute on chronic combined systolic (congestive) and diastolic (congestive) heart failure: Secondary | ICD-10-CM

## 2024-08-23 ENCOUNTER — Ambulatory Visit

## 2024-08-23 VITALS — BP 130/74 | HR 64 | Ht 75.0 in | Wt 223.8 lb

## 2024-08-23 DIAGNOSIS — I5022 Chronic systolic (congestive) heart failure: Secondary | ICD-10-CM

## 2024-08-23 DIAGNOSIS — I10 Essential (primary) hypertension: Secondary | ICD-10-CM

## 2024-08-23 DIAGNOSIS — I428 Other cardiomyopathies: Secondary | ICD-10-CM | POA: Diagnosis not present

## 2024-08-23 DIAGNOSIS — E782 Mixed hyperlipidemia: Secondary | ICD-10-CM | POA: Diagnosis not present

## 2024-08-23 MED ORDER — CARVEDILOL 6.25 MG PO TABS: 6.2500 mg | ORAL_TABLET | Freq: Two times a day (BID) | ORAL | 3 refills | Status: AC

## 2024-08-23 MED ORDER — FARXIGA 10 MG PO TABS: 10.0000 mg | ORAL_TABLET | Freq: Every day | ORAL | 3 refills | Status: AC

## 2024-08-23 NOTE — Assessment & Plan Note (Signed)
 Continue atorvastatin  20 mg once daily. Lipid panel from 12/03/2023 LDL 87, HDL 44, total cholesterol 854 and triglycerides 73.

## 2024-08-23 NOTE — Addendum Note (Signed)
 Addended by: SHERRE ADE I on: 08/23/2024 11:23 AM   Modules accepted: Orders

## 2024-08-23 NOTE — Patient Instructions (Signed)
 Medication Instructions:  Your physician has recommended you make the following change in your medication:   STOP: Metoprolol  STOP: Hydralazine  START: Carvedilol  6.25 mg two times daily  *If you need a refill on your cardiac medications before your next appointment, please call your pharmacy*  Lab Work: Your physician recommends that you return for lab work in:   Labs today: CMP, Magnesium, Pro BNP, CBC  Testing/Procedures: None  Follow-Up: At [facility], you and your health needs are our priority.  As part of our continuing mission to provide you with exceptional heart care, our providers are all part of one team.  This team includes your primary Cardiologist (physician) and Advanced Practice Providers or APPs (Physician Assistants and Nurse Practitioners) who all work together to provide you with the care you need, when you need it.  Your next appointment:   6 month(s)  Provider:   Alean Kobus, MD    We recommend signing up for the patient portal called MyChart.  Sign up information is provided on this After Visit Summary.  MyChart is used to connect with patients for Virtual Visits (Telemedicine).  Patients are able to view lab/test results, encounter notes, upcoming appointments, etc.  Non-urgent messages can be sent to your provider as well.   To learn more about what you can do with MyChart, go to [email or web address].   Other Instructions None

## 2024-08-23 NOTE — Assessment & Plan Note (Signed)
 Well-controlled. Continue current medications as reviewed above under CHF.

## 2024-08-23 NOTE — Assessment & Plan Note (Signed)
 Euvolemic and compensated. NYHA class I. Functional status somewhat limited due to his seizure disorder and hip pain.  Continue salt restriction below 2 g/day. Continue furosemide  40 mg once daily. He is currently taking potassium chloride  supplementation 20 mEq once daily.  Will obtain  CMP , magnesium, proBNP, CBC.  Guideline-directed medical therapy well optimized. Continue Entresto  97-103 mg twice daily Continue spironolactone  25 mg once daily Continue Farxiga  10 mg once daily. Will discontinue metoprolol  succinate and hydralazine . Start carvedilol  6.25 mg twice daily. He also continues on dual 60 mg once daily which he feels helps with his symptoms with exertion.

## 2024-08-23 NOTE — Progress Notes (Signed)
 Cardiology Consultation:    Date:  08/23/2024   ID:  Jim Garcia, DOB 04-Oct-1966, MRN [id number]  PCP:  Delbert Clam, MD  Cardiologist:  Alean JONELLE Kobus, MD   Referring MD: Delbert Clam, MD   No chief complaint on file.    ASSESSMENT AND PLAN:   [name] 58 year old male with history of CHF with reduced LVEF, nonischemic cardiomyopathy with reduced LVEF 30% and normal coronary angiogram September 2016 at [facility], now improved LVEF to 40 to 45% by last echocardiogram July 2024, mildly dilated ascending aorta 4 cm [by TTE July 2024], diabetes mellitus, hypertension, hyperlipidemia, seizure disorder/epilepsy, chronic right hip pain, smokes recreational marijuana, drinks alcohol socially on rare occasions    Here for follow-up visit.  Problem List Items Addressed This Visit     Non-ischemic cardiomyopathy (HCC) (Chronic)   Nonischemic cardiomyopathy diagnosed September 2016 at [facility], LVEF 30%. Normal coronary angiogram September 2016 at [facility]. Echocardiogram July 2024 LVEF 40 to 45%, global hypokinesis.  Advised to avoid alcohol. Continue with guideline directed medical therapy as discussed under CHF.       Essential hypertension - Primary (Chronic)   Well-controlled. Continue current medications as reviewed above under CHF.       CHF (congestive heart failure) (HCC)   Euvolemic and compensated. NYHA class I. Functional status somewhat limited due to his seizure disorder and hip pain.  Continue salt restriction below 2 g/day. Continue furosemide  40 mg once daily. He is currently taking potassium chloride  supplementation 20 mEq once daily.  Will obtain  CMP , magnesium, proBNP, CBC.  Guideline-directed medical therapy well optimized. Continue Entresto  97-103 mg twice daily Continue spironolactone  25 mg once daily Continue Farxiga  10 mg once daily. Will discontinue metoprolol  succinate  and hydralazine . Start carvedilol  6.25 mg twice daily. He also continues on dual 60 mg once daily which he feels helps with his symptoms with exertion.      Hyperlipidemia   Continue atorvastatin  20 mg once daily. Lipid panel from 12/03/2023 LDL 87, HDL 44, total cholesterol 854 and triglycerides 73.      Return to clinic tentatively in 6 months.   History of Present Illness:    Jim Garcia is a 58 y.o. male who is being seen today for follow-up visit. PCP is Delbert Clam, MD. Last visit with me in the office was 12-14-2023.  Pleasant man here for the visit by himself.  Currently lives with his girlfriend.  He does not drive given seizure disorder.  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  CHF with reduced LVEF, nonischemic cardiomyopathy with reduced LVEF 30% and normal coronary angiogram September 2016 at [facility], now improved LVEF to 40 to 45% by last echocardiogram July 2024, mildly dilated ascending aorta 4 cm [by TTE July 2024], diabetes mellitus, hypertension, hyperlipidemia, seizure disorder/epilepsy, chronic right hip pain, smokes recreational marijuana, drinks alcohol socially on rare occasions   Mentions overall he has no significant cardiac symptoms.  His most prominent limitation is ongoing seizure disorder that limits his abilities to drive.  Continues to follow-up closely with neurologist.  Physically functional status is somewhat limited due to hip pain Denies any chest pain, shortness of breath, orthopnea paroxysmal nocturnal dyspnea. Functional status is somewhat limited  Has good awareness of the medications he consumes and takes them consistently. He has been consistently taking his Imdur  30 mg 2 times a day  to help with his afternoon exertion activities.  Recommended he can just take Imdur  60 mg together in the morning given the sustained effect of the drug action. He mentions  hydralazine  having to be taken 3 times a day has been difficult but he has been doing so.  Does not smoke. Continues to smoke marijuana as he mentions this helps with his seizures. No other illicit drug use. Rare alcohol consumption on social occasions.  Past Medical History:  Diagnosis Date   Acute on chronic combined systolic and diastolic CHF, NYHA class 3 (HCC) 04/14/2018   Acute on chronic systolic heart failure (HCC) 01/26/2022   CHF (congestive heart failure) (HCC)    Chronic bilateral low back pain 10/29/2023   Edema 06/12/2015   Essential hypertension 04/15/2018   Focal epilepsy with impairment of consciousness (HCC) 04/17/2024   Generalized muscle weakness 05/22/2023   Hip pain, bilateral 10/29/2023   Hyperlipidemia 06/12/2015   Hypokalemia    Lung nodule 04/17/2024   Mild neurocognitive disorder due to another medical condition, with behavioral disturbance 06/06/2024   Morbid obesity due to excess calories (HCC) 06/13/2015   Non-ischemic cardiomyopathy (HCC) 04/15/2018   Obesity (BMI 30.0-34.9) 11/24/2018   Seizure disorder (HCC) 06/13/2024   Sleep apnea, obstructive 04/15/2018   Stage 2 hypertension 01/26/2022   Syncopal episodes 01/26/2022   Type 2 diabetes mellitus (HCC) 07/24/2015    Past Surgical History:  Procedure Laterality Date   NO PAST SURGERIES      Current Medications: Current Meds  Medication Sig   aspirin  EC 81 MG tablet Take 1 tablet (81 mg total) by mouth daily.   atorvastatin  (LIPITOR) 20 MG tablet TAKE 1 TABLET(20 MG) BY MOUTH DAILY   BRIVIACT 100 MG TABS tablet Take 100 mg by mouth 2 (two) times daily.   celecoxib (CELEBREX) 200 MG capsule Take 200 mg by mouth daily.   clotrimazole  (LOTRIMIN ) 1 % cream Apply 1 Application topically 2 (two) times daily.   divalproex (DEPAKOTE) 250 MG DR tablet 750mg  am and 1000mg  pm   FARXIGA  10 MG TABS tablet Take 1 tablet (10 mg total) by mouth daily.   furosemide  (LASIX ) 40 MG tablet TAKE 1 TABLET BY  MOUTH EVERY DAY   hydrALAZINE  (APRESOLINE ) 50 MG tablet Take 1 tablet (50 mg total) by mouth 3 (three) times daily.   isosorbide  mononitrate (IMDUR ) 30 MG 24 hr tablet Take 2 tablets (60 mg total) by mouth daily.   metoprolol  succinate (TOPROL  XL) 25 MG 24 hr tablet Take 1 tablet (25 mg total) by mouth daily.   NAYZILAM 5 MG/0.1ML SOLN Place into both nostrils.   potassium chloride  SA (KLOR-CON  M) 20 MEQ tablet Take 1 tablet (20 mEq total) by mouth daily.   sacubitril -valsartan  (ENTRESTO ) 97-103 MG Take 1 tablet by mouth 2 (two) times daily.   sertraline (ZOLOFT) 50 MG tablet Take 50 mg by mouth daily.   spironolactone  (ALDACTONE ) 25 MG tablet Take 1 tablet (25 mg total) by mouth daily.   TRUEPLUS SAFETY LANCETS 28G MISC 1 Stick by Does not apply route 3 (three) times daily.     Allergies:   Patient has no known allergies.   Social History   Socioeconomic History   Marital status: Single    Spouse name: Not on file   Number of children: 2   Years of education: Not on file   Highest education level: 12th grade  Occupational History   Not on file  Tobacco Use   Smoking status: Former  Smokeless tobacco: Never   Tobacco comments:    quit 15 years ago  Vaping Use   Vaping status: Never Used  Substance and Sexual Activity   Alcohol use: Yes    Comment: occ   Drug use: Yes    Types: Marijuana    Comment: 04/08/21 almost daily   Sexual activity: Not on file  Other Topics Concern   Not on file  Social History Narrative   Not on file   Social Drivers of Health   Financial Resource Strain: Low Risk  (06/07/2024)   Overall Financial Resource Strain (CARDIA)    Difficulty of Paying Living Expenses: Not very hard  Food Insecurity: Patient Declined (06/07/2024)   Hunger Vital Sign    Worried About Running Out of Food in the Last Year: Patient declined    Ran Out of Food in the Last Year: Patient declined  Transportation Needs: No Transportation Needs (06/07/2024)   PRAPARE -  Administrator, Civil Service (Medical): No    Lack of Transportation (Non-Medical): No  Physical Activity: Inactive (06/07/2024)   Exercise Vital Sign    Days of Exercise per Week: 0 days    Minutes of Exercise per Session: Not on file  Stress: Stress Concern Present (06/07/2024)   [employer] of Occupational Health - Occupational Stress Questionnaire    Feeling of Stress: To some extent  Social Connections: Moderately Isolated (06/07/2024)   Social Connection and Isolation Panel    Frequency of Communication with Friends and Family: Three times a week    Frequency of Social Gatherings with Friends and Family: More than three times a week    Attends Religious Services: More than 4 times per year    Active Member of [employer] or Organizations: No    Attends Engineer, structural: Not on file    Marital Status: Divorced     Family History: The patient's family history includes Diabetes in his father and mother; Hypertension in his mother. ROS:   Please see the history of present illness.    All 14 point review of systems negative except as described per history of present illness.  EKGs/Labs/Other Studies Reviewed:    The following studies were reviewed today:   EKG:       Recent Labs: 03/10/2024: ALT 5; BUN 12; Creatinine, Ser 1.08; Potassium 4.5; Sodium 143  Recent Lipid Panel    Component Value Date/Time   CHOL 145 12/03/2023 1048   TRIG 73 12/03/2023 1048   HDL 44 12/03/2023 1048   CHOLHDL 3.3 12/03/2023 1048   LDLCALC 87 12/03/2023 1048    Physical Exam:    VS:  BP 130/74   Pulse 64   Ht 6' 3 (1.905 m)   Wt 223 lb 12.8 oz (101.5 kg)   SpO2 96%   BMI 27.97 kg/m     Wt Readings from Last 3 Encounters:  08/23/24 223 lb 12.8 oz (101.5 kg)  06/13/24 220 lb (99.8 kg)  03/10/24 215 lb 12.8 oz (97.9 kg)     GENERAL:  Well nourished, well developed in no acute distress NECK: No JVD; No carotid bruits CARDIAC: RRR, S1 and S2 present, no murmurs,  no rubs, no gallops CHEST:  Clear to auscultation without rales, wheezing or rhonchi  Extremities: No pitting pedal edema. Pulses bilaterally symmetric with radial 2+ and dorsalis pedis 2+ NEUROLOGIC:  Alert and oriented x 3  Medication Adjustments/Labs and Tests Ordered: Current medicines are reviewed at length with the patient  today.  Concerns regarding medicines are outlined above.  No orders of the defined types were placed in this encounter.  No orders of the defined types were placed in this encounter.   Signed, Alean jess Kobus, MD, MPH, [facility]. 08/23/2024 11:02 AM    [facility]

## 2024-08-23 NOTE — Assessment & Plan Note (Signed)
 Nonischemic cardiomyopathy diagnosed September 2016 at [facility], LVEF 30%. Normal coronary angiogram September 2016 at [facility]. Echocardiogram July 2024 LVEF 40 to 45%, global hypokinesis.  Advised to avoid alcohol. Continue with guideline directed medical therapy as discussed under CHF.

## 2024-08-24 LAB — COMPREHENSIVE METABOLIC PANEL WITH GFR
ALT: 6 IU/L (ref 0–44)
AST: 9 IU/L (ref 0–40)
Albumin: 4.3 g/dL (ref 3.8–4.9)
Alkaline Phosphatase: 59 IU/L (ref 47–123)
BUN/Creatinine Ratio: 18 (ref 9–20)
BUN: 19 mg/dL (ref 6–24)
Bilirubin Total: 0.3 mg/dL (ref 0.0–1.2)
CO2: 23 mmol/L (ref 20–29)
Calcium: 9.4 mg/dL (ref 8.7–10.2)
Chloride: 103 mmol/L (ref 96–106)
Creatinine, Ser: 1.06 mg/dL (ref 0.76–1.27)
Globulin, Total: 2.4 g/dL (ref 1.5–4.5)
Glucose: 100 mg/dL — ABNORMAL HIGH (ref 70–99)
Potassium: 4.6 mmol/L (ref 3.5–5.2)
Sodium: 142 mmol/L (ref 134–144)
Total Protein: 6.7 g/dL (ref 6.0–8.5)
eGFR: 82 mL/min/1.73 (ref >59)

## 2024-08-24 LAB — CBC
Hematocrit: 45.6 % (ref 37.5–51.0)
Hemoglobin: 15 g/dL (ref 13.0–17.7)
MCH: 31.8 pg (ref 26.6–33.0)
MCHC: 32.9 g/dL (ref 31.5–35.7)
MCV: 97 fL (ref 79–97)
Platelets: 193 x10E3/uL (ref 150–450)
RBC: 4.71 x10E6/uL (ref 4.14–5.80)
RDW: 12.4 % (ref 11.6–15.4)
WBC: 10.2 x10E3/uL (ref 3.4–10.8)

## 2024-08-24 LAB — PRO B NATRIURETIC PEPTIDE: NT-Pro BNP: 166 pg/mL (ref 0–210)

## 2024-08-24 LAB — MAGNESIUM: Magnesium: 2 mg/dL (ref 1.6–2.3)

## 2024-11-16 ENCOUNTER — Other Ambulatory Visit: Payer: Self-pay

## 2024-11-16 DIAGNOSIS — I428 Other cardiomyopathies: Secondary | ICD-10-CM

## 2024-11-16 DIAGNOSIS — I5022 Chronic systolic (congestive) heart failure: Secondary | ICD-10-CM

## 2024-12-01 DIAGNOSIS — F339 Major depressive disorder, recurrent, unspecified: Secondary | ICD-10-CM | POA: Insufficient documentation

## 2024-12-07 ENCOUNTER — Other Ambulatory Visit: Payer: Self-pay

## 2024-12-09 ENCOUNTER — Other Ambulatory Visit: Payer: Self-pay

## 2024-12-13 ENCOUNTER — Ambulatory Visit

## 2024-12-14 ENCOUNTER — Ambulatory Visit: Admitting: Family Medicine
